# Patient Record
Sex: Female | Born: 1970 | Race: Black or African American | Hispanic: No | Marital: Single | Smoking: Never smoker
Health system: Southern US, Community
[De-identification: ages and names within clinical notes are randomized; demographics above are authoritative.]

## PROBLEM LIST (undated history)

## (undated) DIAGNOSIS — F419 Anxiety disorder, unspecified: Secondary | ICD-10-CM

## (undated) DIAGNOSIS — J069 Acute upper respiratory infection, unspecified: Secondary | ICD-10-CM

## (undated) DIAGNOSIS — J45909 Unspecified asthma, uncomplicated: Secondary | ICD-10-CM

## (undated) DIAGNOSIS — K5792 Diverticulitis of intestine, part unspecified, without perforation or abscess without bleeding: Secondary | ICD-10-CM

## (undated) DIAGNOSIS — Z972 Presence of dental prosthetic device (complete) (partial): Secondary | ICD-10-CM

## (undated) DIAGNOSIS — D573 Sickle-cell trait: Secondary | ICD-10-CM

## (undated) DIAGNOSIS — K279 Peptic ulcer, site unspecified, unspecified as acute or chronic, without hemorrhage or perforation: Secondary | ICD-10-CM

## (undated) DIAGNOSIS — D649 Anemia, unspecified: Secondary | ICD-10-CM

## (undated) DIAGNOSIS — R2232 Localized swelling, mass and lump, left upper limb: Secondary | ICD-10-CM

## (undated) DIAGNOSIS — K219 Gastro-esophageal reflux disease without esophagitis: Secondary | ICD-10-CM

## (undated) DIAGNOSIS — I10 Essential (primary) hypertension: Secondary | ICD-10-CM

## (undated) HISTORY — DX: Sickle-cell trait: D57.3

## (undated) HISTORY — DX: Peptic ulcer, site unspecified, unspecified as acute or chronic, without hemorrhage or perforation: K27.9

## (undated) HISTORY — DX: Anxiety disorder, unspecified: F41.9

## (undated) HISTORY — PX: BREAST BIOPSY: SHX20

## (undated) HISTORY — DX: Acute upper respiratory infection, unspecified: J06.9

## (undated) HISTORY — DX: Anemia, unspecified: D64.9

## (undated) HISTORY — PX: TONSILLECTOMY: SUR1361

## (undated) HISTORY — PX: ANAL FISTULECTOMY: SHX1139

## (undated) HISTORY — DX: Unspecified asthma, uncomplicated: J45.909

## (undated) HISTORY — DX: Diverticulitis of intestine, part unspecified, without perforation or abscess without bleeding: K57.92

---

## 1998-04-10 ENCOUNTER — Emergency Department (HOSPITAL_COMMUNITY): Admission: EM | Admit: 1998-04-10 | Discharge: 1998-04-10 | Payer: Self-pay | Admitting: Emergency Medicine

## 1998-08-19 ENCOUNTER — Inpatient Hospital Stay (HOSPITAL_COMMUNITY): Admission: AD | Admit: 1998-08-19 | Discharge: 1998-08-19 | Payer: Self-pay | Admitting: *Deleted

## 1998-12-08 ENCOUNTER — Emergency Department (HOSPITAL_COMMUNITY): Admission: EM | Admit: 1998-12-08 | Discharge: 1998-12-08 | Payer: Self-pay

## 1999-08-10 ENCOUNTER — Emergency Department (HOSPITAL_COMMUNITY): Admission: EM | Admit: 1999-08-10 | Discharge: 1999-08-10 | Payer: Self-pay | Admitting: Emergency Medicine

## 2000-05-07 ENCOUNTER — Emergency Department (HOSPITAL_COMMUNITY): Admission: EM | Admit: 2000-05-07 | Discharge: 2000-05-07 | Payer: Self-pay | Admitting: Emergency Medicine

## 2000-07-10 ENCOUNTER — Emergency Department (HOSPITAL_COMMUNITY): Admission: EM | Admit: 2000-07-10 | Discharge: 2000-07-10 | Payer: Self-pay | Admitting: Emergency Medicine

## 2000-07-10 ENCOUNTER — Encounter: Payer: Self-pay | Admitting: Emergency Medicine

## 2004-05-29 ENCOUNTER — Emergency Department (HOSPITAL_COMMUNITY): Admission: EM | Admit: 2004-05-29 | Discharge: 2004-05-29 | Payer: Self-pay | Admitting: Emergency Medicine

## 2005-04-25 ENCOUNTER — Emergency Department (HOSPITAL_COMMUNITY): Admission: EM | Admit: 2005-04-25 | Discharge: 2005-04-25 | Payer: Self-pay | Admitting: Emergency Medicine

## 2005-08-09 ENCOUNTER — Emergency Department (HOSPITAL_COMMUNITY): Admission: EM | Admit: 2005-08-09 | Discharge: 2005-08-10 | Payer: Self-pay | Admitting: Emergency Medicine

## 2006-02-19 ENCOUNTER — Emergency Department (HOSPITAL_COMMUNITY): Admission: EM | Admit: 2006-02-19 | Discharge: 2006-02-19 | Payer: Self-pay | Admitting: Emergency Medicine

## 2006-09-01 ENCOUNTER — Emergency Department (HOSPITAL_COMMUNITY): Admission: EM | Admit: 2006-09-01 | Discharge: 2006-09-02 | Payer: Self-pay | Admitting: Emergency Medicine

## 2006-11-29 ENCOUNTER — Emergency Department (HOSPITAL_COMMUNITY): Admission: EM | Admit: 2006-11-29 | Discharge: 2006-11-29 | Payer: Self-pay | Admitting: Emergency Medicine

## 2007-01-01 ENCOUNTER — Encounter: Payer: Self-pay | Admitting: Emergency Medicine

## 2007-01-02 ENCOUNTER — Inpatient Hospital Stay (HOSPITAL_COMMUNITY): Admission: EM | Admit: 2007-01-02 | Discharge: 2007-01-07 | Payer: Self-pay | Admitting: Emergency Medicine

## 2007-09-24 ENCOUNTER — Emergency Department (HOSPITAL_COMMUNITY): Admission: EM | Admit: 2007-09-24 | Discharge: 2007-09-24 | Payer: Self-pay | Admitting: Emergency Medicine

## 2008-11-15 ENCOUNTER — Emergency Department (HOSPITAL_COMMUNITY): Admission: EM | Admit: 2008-11-15 | Discharge: 2008-11-15 | Payer: Self-pay | Admitting: Emergency Medicine

## 2008-11-28 ENCOUNTER — Encounter: Admission: RE | Admit: 2008-11-28 | Discharge: 2008-11-28 | Payer: Self-pay | Admitting: Specialist

## 2008-11-28 ENCOUNTER — Encounter: Payer: Self-pay | Admitting: Internal Medicine

## 2008-12-19 ENCOUNTER — Ambulatory Visit: Payer: Self-pay | Admitting: Internal Medicine

## 2008-12-19 DIAGNOSIS — R05 Cough: Secondary | ICD-10-CM

## 2008-12-19 DIAGNOSIS — R519 Headache, unspecified: Secondary | ICD-10-CM | POA: Insufficient documentation

## 2008-12-19 DIAGNOSIS — I10 Essential (primary) hypertension: Secondary | ICD-10-CM | POA: Insufficient documentation

## 2008-12-19 DIAGNOSIS — J45909 Unspecified asthma, uncomplicated: Secondary | ICD-10-CM | POA: Insufficient documentation

## 2008-12-19 DIAGNOSIS — R51 Headache: Secondary | ICD-10-CM

## 2009-01-08 ENCOUNTER — Ambulatory Visit: Payer: Self-pay | Admitting: Internal Medicine

## 2009-01-20 ENCOUNTER — Encounter: Admission: RE | Admit: 2009-01-20 | Discharge: 2009-01-20 | Payer: Self-pay | Admitting: Specialist

## 2009-05-28 ENCOUNTER — Emergency Department (HOSPITAL_COMMUNITY): Admission: EM | Admit: 2009-05-28 | Discharge: 2009-05-28 | Payer: Self-pay | Admitting: Emergency Medicine

## 2009-12-28 ENCOUNTER — Emergency Department (HOSPITAL_COMMUNITY): Admission: EM | Admit: 2009-12-28 | Discharge: 2009-12-28 | Payer: Self-pay | Admitting: Family Medicine

## 2010-05-17 ENCOUNTER — Encounter: Payer: Self-pay | Admitting: Internal Medicine

## 2010-07-09 LAB — POCT URINALYSIS DIPSTICK
Ketones, ur: NEGATIVE mg/dL
Nitrite: NEGATIVE
Protein, ur: NEGATIVE mg/dL
Urobilinogen, UA: 0.2 mg/dL (ref 0.0–1.0)

## 2010-07-09 LAB — POCT PREGNANCY, URINE: Preg Test, Ur: NEGATIVE

## 2010-07-15 LAB — DIFFERENTIAL
Basophils Absolute: 0 10*3/uL (ref 0.0–0.1)
Basophils Relative: 0 % (ref 0–1)
Eosinophils Absolute: 0 10*3/uL (ref 0.0–0.7)
Neutro Abs: 3.9 10*3/uL (ref 1.7–7.7)
Neutrophils Relative %: 77 % (ref 43–77)

## 2010-07-15 LAB — POCT I-STAT, CHEM 8
Chloride: 104 mEq/L (ref 96–112)
HCT: 34 % — ABNORMAL LOW (ref 36.0–46.0)
Hemoglobin: 11.6 g/dL — ABNORMAL LOW (ref 12.0–15.0)
Potassium: 3.7 mEq/L (ref 3.5–5.1)
Sodium: 137 mEq/L (ref 135–145)

## 2010-07-15 LAB — CBC
MCHC: 34.7 g/dL (ref 30.0–36.0)
Platelets: 214 10*3/uL (ref 150–400)
RBC: 3.76 MIL/uL — ABNORMAL LOW (ref 3.87–5.11)
RDW: 12.5 % (ref 11.5–15.5)

## 2010-09-08 NOTE — H&P (Signed)
Mandy Riggs, Mandy Riggs             ACCOUNT NO.:  [id number]   MEDICAL RECORD NO.:  [id number]          PATIENT TYPE:  INP   LOCATION:  4733                         FACILITY:  [facility]   PHYSICIAN:  Wilson Singer, M.D.DATE OF BIRTH:  24-Apr-1971   DATE OF ADMISSION:  01/02/2007  DATE OF DISCHARGE:                              HISTORY & PHYSICAL   HISTORY:  This is a very pleasant, 40 year old lady who had a  tonsillectomy four days ago on December 29, 2006, and now presents with  a one-day history of fever and cough.  She has pleuritic chest pain.   PAST MEDICAL HISTORY:  Hypertension.   PAST SURGICAL HISTORY:  1. Tonsillectomy as mentioned above.  2. Surgery for anal fistula in 2001 after birth of her baby.   SOCIAL HISTORY:  She has been married for 2 years.  She does not smoke,  and does not drink alcohol.  She works at [employer] as a Engineer, water.   MEDICATIONS:  Amoxicillin, Dilaudid, and Tylenol with codeine #3 since  the tonsillectomy.  She also takes Benicar 20 mg daily.   ALLERGIES:  VICODIN, which produces itching.   REVIEW OF SYSTEMS:  Apart from the symptoms mentioned above, there are  no other symptoms referable to all systems reviewed.   PHYSICAL EXAMINATION:  VITAL SIGNS:  Temperature 102.2, blood pressure  150/90, pulse 120, saturation 98% on room air.  GENERAL:  She is not toxic, and there is no respiratory distress.  CARDIOVASCULAR:  Heart sounds are present and normal without murmurs.  LUNGS:  Lung fields are clinically clear.  ABDOMEN:  Soft and nontender, with no hepatosplenomegaly.  NEUROLOGIC:  She is alert and oriented with no focal neurological signs.   INVESTIGATIONS:  Hemoglobin 11.2, white blood cell count 11.5, platelets  277.  Sodium 134, potassium 3.6, BUN 4, glucose 111, creatinine 0.9.  D-  dimer is elevated to 0.81.  Chest x-ray is consistent with left sided  pneumonia.   IMPRESSION:  1. Left pneumonia.  2.  Possible pulmonary embolism.   PLAN:  1. Admit.  2. Intravenous antibiotics.  3. CT angio chest.   Further recommendations will depend on the patient's hospital progress.      Wilson Singer, M.D.  Electronically Signed     NCG/MEDQ  D:  01/02/2007  T:  01/02/2007  Job:  [id number]   cc:   Lacretia Leigh. Quintella Reichert, M.D.  Hermelinda Medicus, M.D.

## 2010-09-11 NOTE — Discharge Summary (Signed)
NAMEJANNELL, Mandy Riggs             ACCOUNT NO.:  [id number]   MEDICAL RECORD NO.:  [id number]          PATIENT TYPE:  INP   LOCATION:  4733                         FACILITY:  [facility]   PHYSICIAN:  Lonia Blood, M.D.      DATE OF BIRTH:  [birth year]   DATE OF ADMISSION:  01/02/2007  DATE OF DISCHARGE:  01/07/2007                               DISCHARGE SUMMARY   PRIMARY CARE PHYSICIAN:  Dr. Feliciana Rossetti.   DISCHARGE DIAGNOSES:  1. Left lower lobe pneumonia.  2. Status post tonsillectomy.  3. Anemia.  4. Hypertension.  5. Migraine headache.   DISCHARGE MEDICATIONS:  1. Augmentin 875 mg p.o. b.i.d. for 3 days.  2. Benicar 1 tablet daily.  3. Dilaudid 4 mg q.4-6 h p.r.n.  4. Tylenol with Codeine as needed.   DISPOSITION:  The patient is to follow up with her primary care  physician as needed.  Also follow up with her ENT surgeon, Dr. Hermelinda Medicus.   PROCEDURES PERFORMED THIS ADMISSION INCLUDE:  1. Chest x-ray that showed left-sided pneumonia on January 02, 2007.  2. Chest CT without contrast that ruled out pulmonary embolism.   BRIEF HISTORY AND PHYSICAL:  Please refer to dictated history and  physical on admission.  In short, however, this is a 40 year old lady  that had tonsillectomy 4 days prior to coming in.  She came in secondary  to severe pain in the back of her throat and also pleuritic chest pain.  Initial workup in the ER suggested a left lower lobe pneumonia.  Hence,  she was admitted for further management.   HOSPITAL COURSE:  1. Left lower lobe pneumonia:  The patient was admitted, started on IV      antibiotics and supportive care.  Her temperature continued to be      around 102 on admission.  She subsequently improved with      antibiotics daily.  She was on Rocephin and Zithromax but at the      time of discharge she was switched to Augmentin for both her      pneumonia as well as what appears to be tonsillitis.  2. Status post tonsillectomy:  The patient  was having severe sore      throat and problem with swallowing.  Her ENT surgeon was      subsequently consulted.  She was continued on antibiotic and also      some sprays.  She improved      tremendously and was able to eat and drink at the time of      discharge.  3. Hypertension:  The patient was continued on her antihypertensive      and she seemed to have responded to that appropriately.  Further      treatment measures were to be continued as an outpatient.      Lonia Blood, M.D.  Electronically Signed     LG/MEDQ  D:  02/22/2007  T:  02/22/2007  Job:  [id number]

## 2010-10-08 ENCOUNTER — Inpatient Hospital Stay (INDEPENDENT_AMBULATORY_CARE_PROVIDER_SITE_OTHER)
Admission: RE | Admit: 2010-10-08 | Discharge: 2010-10-08 | Disposition: A | Discharge status: Home / Self Care | Payer: 59 | Source: Ambulatory Visit | Attending: Family Medicine | Admitting: Family Medicine

## 2010-10-08 DIAGNOSIS — R51 Headache: Secondary | ICD-10-CM

## 2010-10-08 DIAGNOSIS — I10 Essential (primary) hypertension: Secondary | ICD-10-CM

## 2010-10-08 LAB — POCT I-STAT, CHEM 8
BUN: 9 mg/dL (ref 6–23)
Chloride: 104 mEq/L (ref 96–112)
HCT: 36 % (ref 36.0–46.0)
Sodium: 142 mEq/L (ref 135–145)

## 2010-11-11 ENCOUNTER — Inpatient Hospital Stay (INDEPENDENT_AMBULATORY_CARE_PROVIDER_SITE_OTHER)
Admission: RE | Admit: 2010-11-11 | Discharge: 2010-11-11 | Disposition: A | Discharge status: Home / Self Care | Payer: 59 | Source: Ambulatory Visit | Attending: Emergency Medicine | Admitting: Emergency Medicine

## 2010-11-11 DIAGNOSIS — M779 Enthesopathy, unspecified: Secondary | ICD-10-CM

## 2010-12-13 ENCOUNTER — Inpatient Hospital Stay (INDEPENDENT_AMBULATORY_CARE_PROVIDER_SITE_OTHER)
Admission: RE | Admit: 2010-12-13 | Discharge: 2010-12-13 | Disposition: A | Discharge status: Home / Self Care | Payer: Self-pay | Source: Ambulatory Visit | Attending: Family Medicine | Admitting: Family Medicine

## 2010-12-13 DIAGNOSIS — IMO0001 Reserved for inherently not codable concepts without codable children: Secondary | ICD-10-CM

## 2010-12-13 DIAGNOSIS — W19XXXA Unspecified fall, initial encounter: Secondary | ICD-10-CM

## 2011-02-05 LAB — DIFFERENTIAL
Basophils Absolute: 0
Eosinophils Relative: 1
Lymphocytes Relative: 9 — ABNORMAL LOW
Lymphs Abs: 1
Neutro Abs: 9 — ABNORMAL HIGH

## 2011-02-05 LAB — I-STAT 8, (EC8 V) (CONVERTED LAB)
Acid-Base Excess: 3 — ABNORMAL HIGH
Chloride: 100
HCT: 35 — ABNORMAL LOW
Hemoglobin: 11.9 — ABNORMAL LOW
Potassium: 3.6
Sodium: 134 — ABNORMAL LOW
TCO2: 29

## 2011-02-05 LAB — POCT I-STAT CREATININE: Operator id: 146091

## 2011-02-05 LAB — CBC
HCT: 32.3 — ABNORMAL LOW
HCT: 32.4 — ABNORMAL LOW
Hemoglobin: 11.2 — ABNORMAL LOW
MCHC: 34.2
MCHC: 34.3
MCV: 84.3
Platelets: 277
Platelets: 419 — ABNORMAL HIGH
RBC: 3.69 — ABNORMAL LOW
RDW: 12.1
RDW: 12.1
RDW: 12.3
WBC: 11.5 — ABNORMAL HIGH
WBC: 12.2 — ABNORMAL HIGH
WBC: 7.9

## 2011-02-05 LAB — BASIC METABOLIC PANEL
BUN: 6
CO2: 28
Chloride: 106
Glucose, Bld: 102 — ABNORMAL HIGH
Potassium: 4.3

## 2011-02-05 LAB — CARDIAC PANEL(CRET KIN+CKTOT+MB+TROPI)
CK, MB: 0.8
CK, MB: 0.9
Total CK: 39
Troponin I: 0.02

## 2011-02-05 LAB — URINALYSIS, ROUTINE W REFLEX MICROSCOPIC
Glucose, UA: NEGATIVE
Ketones, ur: NEGATIVE
Nitrite: NEGATIVE
Protein, ur: NEGATIVE
pH: 8

## 2011-02-05 LAB — COMPREHENSIVE METABOLIC PANEL
Alkaline Phosphatase: 41
BUN: 3 — ABNORMAL LOW
CO2: 29
Calcium: 8.8
GFR calc non Af Amer: >60
Glucose, Bld: 118 — ABNORMAL HIGH
Potassium: 3.9
Total Protein: 6.8

## 2011-02-05 LAB — CULTURE, BLOOD (ROUTINE X 2): Culture: NO GROWTH

## 2011-02-05 LAB — D-DIMER, QUANTITATIVE: D-Dimer, Quant: 0.81 — ABNORMAL HIGH

## 2011-02-08 LAB — CBC
MCHC: 34.8
MCV: 83.1
Platelets: 292
RBC: 4.12
WBC: 5.1

## 2011-02-08 LAB — DIFFERENTIAL
Eosinophils Absolute: 0.1
Eosinophils Relative: 2
Lymphocytes Relative: 27
Lymphs Abs: 1.4
Monocytes Absolute: 0.5
Monocytes Relative: 9
Neutro Abs: 3.1

## 2011-02-08 LAB — COMPREHENSIVE METABOLIC PANEL
ALT: 13
AST: 16
Albumin: 3.7
Calcium: 9.5
Chloride: 108
Creatinine, Ser: 0.77
GFR calc Af Amer: >60
GFR calc non Af Amer: >60
Glucose, Bld: 97
Total Bilirubin: 0.4

## 2011-02-08 LAB — LIPASE, BLOOD: Lipase: 22

## 2011-02-08 LAB — URINALYSIS, ROUTINE W REFLEX MICROSCOPIC
Hgb urine dipstick: NEGATIVE
Protein, ur: NEGATIVE
Urobilinogen, UA: 0.2

## 2011-02-08 LAB — PREGNANCY, URINE: Preg Test, Ur: NEGATIVE

## 2011-06-05 ENCOUNTER — Encounter (HOSPITAL_COMMUNITY): Payer: Self-pay

## 2011-06-05 ENCOUNTER — Emergency Department (HOSPITAL_COMMUNITY)
Admission: EM | Admit: 2011-06-05 | Discharge: 2011-06-05 | Disposition: A | Discharge status: Home / Self Care | Payer: 59 | Source: Home / Self Care | Attending: Family Medicine | Admitting: Family Medicine

## 2011-06-05 DIAGNOSIS — M659 Synovitis and tenosynovitis, unspecified: Secondary | ICD-10-CM

## 2011-06-05 DIAGNOSIS — M7751 Other enthesopathy of right foot: Secondary | ICD-10-CM

## 2011-06-05 HISTORY — DX: Essential (primary) hypertension: I10

## 2011-06-05 MED ORDER — DICLOFENAC POTASSIUM 50 MG PO TABS: 50.0000 mg | ORAL_TABLET | Freq: Three times a day (TID) | ORAL | Status: AC

## 2011-06-05 NOTE — ED Notes (Signed)
Pt has had rt inner leg pain that started one week ago, started at ankle and now painful up to calf.  No known injury.

## 2011-06-05 NOTE — ED Provider Notes (Signed)
History     CSN: [id number]  Arrival date & time 06/05/11  0902   First MD Initiated Contact with Patient 06/05/11 [phone]      Chief Complaint  Patient presents with  . Leg Pain    (Consider location/radiation/quality/duration/timing/severity/associated sxs/prior treatment) Patient is a 41 y.o. female presenting with leg pain. The history is provided by the patient.  Leg Pain  The incident occurred more than 1 week ago (felt burning to medial ankle and has spread to distal medial calf, no swelling,, NKI). The incident occurred at home. There was no injury mechanism. The pain is present in the left ankle. The pain is mild. She reports no foreign bodies present.    Past Medical History  Diagnosis Date  . Hypertension     History reviewed. No pertinent past surgical history.  History reviewed. No pertinent family history.  History  Substance Use Topics  . Smoking status: Never Smoker   . Smokeless tobacco: Not on file  . Alcohol Use: No    OB History    Grav Para Term Preterm Abortions TAB SAB Ect Mult Living                  Review of Systems  Constitutional: Negative.   Gastrointestinal: Negative.   Musculoskeletal: Positive for myalgias.  Skin: Negative.     Allergies  Hydrocodone  Home Medications   Current Outpatient Rx  Name Route Sig Dispense Refill  . OLMESARTAN MEDOXOMIL 5 MG PO TABS Oral Take 5 mg by mouth daily.      BP 142/91  Pulse 94  Temp(Src) 96.9 F (36.1 C) (Oral)  Resp 18  LMP 05/26/2011  Physical Exam  Nursing note and vitals reviewed. Constitutional: She is oriented to person, place, and time. She appears well-developed and well-nourished.  Musculoskeletal: She exhibits tenderness. She exhibits no edema.       Right ankle: She exhibits no swelling, no ecchymosis, no deformity and normal pulse. tenderness. Medial malleolus tenderness found. Achilles tendon normal.       Feet:  Neurological: She is alert and oriented to person,  place, and time.  Skin: Skin is warm and dry. No erythema.    ED Course  Procedures (including critical care time)  Labs Reviewed - No data to display No results found.   1. Tendonitis of ankle, right       MDM          Barkley Bruns, MD 06/05/11 [phone]

## 2012-02-22 ENCOUNTER — Other Ambulatory Visit (HOSPITAL_COMMUNITY): Payer: Self-pay | Admitting: Unknown Physician Specialty

## 2012-02-22 DIAGNOSIS — Z1231 Encounter for screening mammogram for malignant neoplasm of breast: Secondary | ICD-10-CM

## 2012-03-14 ENCOUNTER — Ambulatory Visit (HOSPITAL_COMMUNITY)
Admission: RE | Admit: 2012-03-14 | Discharge: 2012-03-14 | Disposition: A | Discharge status: Home / Self Care | Payer: 59 | Source: Ambulatory Visit | Attending: Unknown Physician Specialty | Admitting: Unknown Physician Specialty

## 2012-03-14 DIAGNOSIS — Z1231 Encounter for screening mammogram for malignant neoplasm of breast: Secondary | ICD-10-CM | POA: Insufficient documentation

## 2012-03-16 ENCOUNTER — Other Ambulatory Visit: Payer: Self-pay | Admitting: *Deleted

## 2012-03-16 ENCOUNTER — Other Ambulatory Visit: Payer: Self-pay | Admitting: Interventional Cardiology

## 2013-03-07 ENCOUNTER — Other Ambulatory Visit: Payer: Self-pay | Admitting: Unknown Physician Specialty

## 2013-03-07 DIAGNOSIS — N63 Unspecified lump in unspecified breast: Secondary | ICD-10-CM

## 2013-03-23 ENCOUNTER — Ambulatory Visit
Admission: RE | Admit: 2013-03-23 | Discharge: 2013-03-23 | Disposition: A | Discharge status: Home / Self Care | Payer: 59 | Source: Ambulatory Visit | Attending: Unknown Physician Specialty | Admitting: Unknown Physician Specialty

## 2013-03-23 DIAGNOSIS — N63 Unspecified lump in unspecified breast: Secondary | ICD-10-CM

## 2014-02-20 ENCOUNTER — Other Ambulatory Visit: Payer: Self-pay

## 2014-02-20 DIAGNOSIS — Z1231 Encounter for screening mammogram for malignant neoplasm of breast: Secondary | ICD-10-CM

## 2014-03-26 ENCOUNTER — Inpatient Hospital Stay: Admission: RE | Admit: 2014-03-26 | Payer: 59 | Source: Ambulatory Visit

## 2014-04-22 ENCOUNTER — Emergency Department (HOSPITAL_COMMUNITY)
Admission: EM | Admit: 2014-04-22 | Discharge: 2014-04-22 | Disposition: A | Discharge status: Home / Self Care | Payer: 59 | Source: Home / Self Care | Attending: Family Medicine | Admitting: Family Medicine

## 2014-04-22 ENCOUNTER — Encounter (HOSPITAL_COMMUNITY): Payer: Self-pay | Admitting: Emergency Medicine

## 2014-04-22 DIAGNOSIS — J040 Acute laryngitis: Secondary | ICD-10-CM

## 2014-04-22 MED ORDER — IPRATROPIUM BROMIDE 0.06 % NA SOLN: 2.0000 | Freq: Four times a day (QID) | NASAL | Status: DC

## 2014-04-22 MED ORDER — PREDNISONE 10 MG PO TABS: 30.0000 mg | ORAL_TABLET | Freq: Every day | ORAL | Status: DC

## 2014-04-22 MED ORDER — TRAMADOL HCL 50 MG PO TABS: 50.0000 mg | ORAL_TABLET | Freq: Every evening | ORAL | Status: DC | PRN

## 2014-04-22 NOTE — ED Notes (Signed)
Pt has been suffering from a cough and hoarse voice for approximately one week.  She has tried OTC medications with little relief.

## 2014-04-22 NOTE — ED Provider Notes (Signed)
Mandy Riggs is a 43 y.o. female who presents to Urgent Care today for hoarse voice cough congestion postnasal drip. Symptoms present for 4 days. Cough is productive. He tried multiple over-the-counter medications which have not helped. No fevers or chills or trouble breathing. Vomiting or diarrhea.   Past Medical History  Diagnosis Date  . Hypertension    No past surgical history on file. History  Substance Use Topics  . Smoking status: Never Smoker   . Smokeless tobacco: Not on file  . Alcohol Use: No   ROS as above Medications: No current facility-administered medications for this encounter.   Current Outpatient Prescriptions  Medication Sig Dispense Refill  . ipratropium (ATROVENT) 0.06 % nasal spray Place 2 sprays into both nostrils 4 (four) times daily. 15 mL 1  . olmesartan (BENICAR) 5 MG tablet Take 5 mg by mouth daily.    . predniSONE (DELTASONE) 10 MG tablet Take 3 tablets (30 mg total) by mouth daily. 15 tablet 0  . traMADol (ULTRAM) 50 MG tablet Take 1 tablet (50 mg total) by mouth at bedtime as needed (cough). 10 tablet 0   Allergies  Allergen Reactions  . Hydrocodone Itching     Exam:  BP 159/88 mmHg  Pulse 81  Temp(Src) 98.2 F (36.8 C) (Oral)  Resp 14  SpO2 98% Gen: Well NAD HEENT: EOMI,  MMM posterior pharynx with cobblestoning. Tympanic membranes occluded by cerumen bilaterally Lungs: Normal work of breathing. CTABL hoarse voice Heart: RRR no MRG Abd: NABS, Soft. Nondistended, Nontender Exts: Brisk capillary refill, warm and well perfused.   No results found for this or any previous visit (from the past 24 hour(s)). No results found.  Assessment and Plan: 43 y.o. female with laryngitis. Treatment with prednisone and Atrovent nasal spray and tramadol for cough.  Discussed warning signs or symptoms. Please see discharge instructions. Patient expresses understanding.     Gregor Hams, MD 04/22/14 [phone]

## 2014-04-22 NOTE — Discharge Instructions (Signed)
Thank you for coming in today. Call or go to the emergency room if you get worse, have trouble breathing, have chest pains, or palpitations.     Laryngitis At the top of your windpipe is your voice box. It is the source of your voice. Inside your voice box are 2 bands of muscles called vocal cords. When you breathe, your vocal cords are relaxed and open so that air can get into the lungs. When you decide to say something, these cords come together and vibrate. The sound from these vibrations goes into your throat and comes out through your mouth as sound. Laryngitis is an inflammation of the vocal cords that causes hoarseness, cough, loss of voice, sore throat, and dry throat. Laryngitis can be temporary (acute) or long-term (chronic). Most cases of acute laryngitis improve with time.Chronic laryngitis lasts for more than 3 weeks. CAUSES Laryngitis can often be related to excessive smoking, talking, or yelling, as well as inhalation of toxic fumes and allergies. Acute laryngitis is usually caused by a viral infection, vocal strain, measles or mumps, or bacterial infections. Chronic laryngitis is usually caused by vocal cord strain, vocal cord injury, postnasal drip, growths on the vocal cords, or acid reflux. SYMPTOMS   Cough.  Sore throat.  Dry throat. RISK FACTORS  Respiratory infections.  Exposure to irritating substances, such as cigarette smoke, excessive amounts of alcohol, stomach acids, and workplace chemicals.  Voice trauma, such as vocal cord injury from shouting or speaking too loud. DIAGNOSIS  Your cargiver will perform a physical exam. During the physical exam, your caregiver will examine your throat. The most common sign of laryngitis is hoarseness. Laryngoscopy may be necessary to confirm the diagnosis of this condition. This procedure allows your caregiver to look into the larynx. HOME CARE INSTRUCTIONS  Drink enough fluids to keep your urine clear or pale yellow.  Rest  until you no longer have symptoms or as directed by your caregiver.  Breathe in moist air.  Take all medicine as directed by your caregiver.  Do not smoke.  Talk as little as possible (this includes whispering).  Write on paper instead of talking until your voice is back to normal.  Follow up with your caregiver if your condition has not improved after 10 days. SEEK MEDICAL CARE IF:   You have trouble breathing.  You cough up blood.  You have persistent fever.  You have increasing pain.  You have difficulty swallowing. MAKE SURE YOU:  Understand these instructions.  Will watch your condition.  Will get help right away if you are not doing well or get worse. Document Released: 04/12/2005 Document Revised: 07/05/2011 Document Reviewed: 06/18/2010 [facility] Patient Information 2015 [place], [place]. This information is not intended to replace advice given to you by your health care provider. Make sure you discuss any questions you have with your health care provider.

## 2014-09-10 ENCOUNTER — Ambulatory Visit (HOSPITAL_BASED_OUTPATIENT_CLINIC_OR_DEPARTMENT_OTHER)
Admission: RE | Admit: 2014-09-10 | Discharge: 2014-09-10 | Disposition: A | Discharge status: Home / Self Care | Payer: 59 | Source: Ambulatory Visit | Attending: Physician Assistant | Admitting: Physician Assistant

## 2014-09-10 ENCOUNTER — Other Ambulatory Visit (HOSPITAL_BASED_OUTPATIENT_CLINIC_OR_DEPARTMENT_OTHER): Payer: Self-pay | Admitting: Physician Assistant

## 2014-09-10 DIAGNOSIS — Z86718 Personal history of other venous thrombosis and embolism: Secondary | ICD-10-CM | POA: Insufficient documentation

## 2014-09-10 DIAGNOSIS — M79605 Pain in left leg: Secondary | ICD-10-CM | POA: Diagnosis not present

## 2014-09-16 ENCOUNTER — Other Ambulatory Visit: Payer: Self-pay | Admitting: Neurology

## 2014-12-20 ENCOUNTER — Ambulatory Visit
Admission: RE | Admit: 2014-12-20 | Discharge: 2014-12-20 | Disposition: A | Discharge status: Home / Self Care | Payer: 59 | Source: Ambulatory Visit

## 2014-12-20 DIAGNOSIS — Z1231 Encounter for screening mammogram for malignant neoplasm of breast: Secondary | ICD-10-CM

## 2014-12-26 ENCOUNTER — Other Ambulatory Visit: Payer: Self-pay | Admitting: Family Medicine

## 2014-12-26 DIAGNOSIS — R2232 Localized swelling, mass and lump, left upper limb: Secondary | ICD-10-CM

## 2014-12-31 ENCOUNTER — Other Ambulatory Visit: Payer: Self-pay | Admitting: Family Medicine

## 2014-12-31 ENCOUNTER — Other Ambulatory Visit: Payer: Self-pay

## 2014-12-31 DIAGNOSIS — R2232 Localized swelling, mass and lump, left upper limb: Secondary | ICD-10-CM

## 2015-01-02 ENCOUNTER — Other Ambulatory Visit: Payer: 59

## 2015-04-07 ENCOUNTER — Ambulatory Visit
Admission: RE | Admit: 2015-04-07 | Discharge: 2015-04-07 | Disposition: A | Discharge status: Home / Self Care | Payer: 59 | Source: Ambulatory Visit | Attending: Family Medicine | Admitting: Family Medicine

## 2015-04-07 ENCOUNTER — Other Ambulatory Visit: Payer: Self-pay | Admitting: Family Medicine

## 2015-04-07 DIAGNOSIS — R2232 Localized swelling, mass and lump, left upper limb: Secondary | ICD-10-CM

## 2015-06-25 DIAGNOSIS — R2232 Localized swelling, mass and lump, left upper limb: Secondary | ICD-10-CM

## 2015-06-25 HISTORY — DX: Localized swelling, mass and lump, left upper limb: R22.32

## 2015-07-10 ENCOUNTER — Other Ambulatory Visit: Payer: Self-pay | Admitting: General Surgery

## 2015-07-14 ENCOUNTER — Encounter (HOSPITAL_BASED_OUTPATIENT_CLINIC_OR_DEPARTMENT_OTHER): Payer: Self-pay | Admitting: *Deleted

## 2015-07-14 NOTE — Pre-Procedure Instructions (Signed)
To come for EKG 

## 2015-07-16 ENCOUNTER — Encounter (HOSPITAL_BASED_OUTPATIENT_CLINIC_OR_DEPARTMENT_OTHER)
Admission: RE | Admit: 2015-07-16 | Discharge: 2015-07-16 | Disposition: A | Discharge status: Home / Self Care | Payer: 59 | Source: Ambulatory Visit | Attending: General Surgery | Admitting: General Surgery

## 2015-07-16 ENCOUNTER — Other Ambulatory Visit: Payer: Self-pay

## 2015-07-16 DIAGNOSIS — I10 Essential (primary) hypertension: Secondary | ICD-10-CM | POA: Insufficient documentation

## 2015-07-16 DIAGNOSIS — Z01810 Encounter for preprocedural cardiovascular examination: Secondary | ICD-10-CM | POA: Insufficient documentation

## 2015-07-20 NOTE — H&P (Signed)
History of Present Illness  The patient is a 45 year old female who presents with a soft tissue mass. She is referred for an enlarging soft tissue mass under her left arm. The patient first noted a small lump under her left arm about 2010. She did have an ultrasound at [facility] at that time. She feels the area has gradually enlarged. This was noted by her primary physician and she was referred for repeat ultrasound. Ultrasound showed a 3.8 x 3.4 x 1.1 cm mass in the superior left axilla which previously measured 1.9 x 0.6 x 1.8 cm in 2010. She notices it is there but does not give her pain or other discomfort.   Other Problems  Gastroesophageal Reflux Disease High blood pressure  Past Surgical History  Breast Biopsy Right. Tonsillectomy  Diagnostic Studies History  Colonoscopy 5-10 years ago Mammogram within last year  Allergies  HYDROcodone Bitartrate *CHEMICALS*  Medication History  ALPRAZolam (0.5MG  Tablet, Oral as needed) Active. AmLODIPine Besylate (5MG  Tablet, Oral) Active. Losartan Potassium (50MG  Tablet, Oral) Active. Medications Reconciled  Social History  Caffeine use Carbonated beverages, Coffee, Tea. No alcohol use No drug use Tobacco use Never smoker.  Family History  Arthritis Mother. Diabetes Mellitus Father, Mother. Hypertension Father, Mother.    Review of Systems  General Not Present- Appetite Loss, Chills, Fatigue, Fever, Night Sweats, Weight Gain and Weight Loss. Skin Not Present- Change in Wart/Mole, Dryness, Hives, Jaundice, New Lesions, Non-Healing Wounds, Rash and Ulcer. HEENT Not Present- Earache, Hearing Loss, Hoarseness, Nose Bleed, Oral Ulcers, Ringing in the Ears, Seasonal Allergies, Sinus Pain, Sore Throat, Visual Disturbances, Wears glasses/contact lenses and Yellow Eyes. Respiratory Not Present- Bloody sputum, Chronic Cough, Difficulty Breathing, Snoring and Wheezing. Cardiovascular Not Present- Chest Pain, Difficulty  Breathing Lying Down, Leg Cramps, Palpitations, Rapid Heart Rate, Shortness of Breath and Swelling of Extremities. Gastrointestinal Not Present- Abdominal Pain, Bloating, Bloody Stool, Change in Bowel Habits, Chronic diarrhea, Constipation, Difficulty Swallowing, Excessive gas, Gets full quickly at meals, Hemorrhoids, Indigestion, Nausea, Rectal Pain and Vomiting. Female Genitourinary Not Present- Frequency, Nocturia, Painful Urination, Pelvic Pain and Urgency. Musculoskeletal Not Present- Back Pain, Joint Pain, Joint Stiffness, Muscle Pain, Muscle Weakness and Swelling of Extremities. Neurological Not Present- Decreased Memory, Fainting, Headaches, Numbness, Seizures, Tingling, Tremor, Trouble walking and Weakness. Psychiatric Not Present- Anxiety, Bipolar, Change in Sleep Pattern, Depression, Fearful and Frequent crying. Endocrine Not Present- Cold Intolerance, Excessive Hunger, Hair Changes, Heat Intolerance, Hot flashes and New Diabetes. Hematology Not Present- Easy Bruising, Excessive bleeding, Gland problems, HIV and Persistent Infections.  Vitals   Weight: 189.2 lb Height: 63in Body Surface Area: 1.89 m Body Mass Index: 33.51 kg/m  Temp.: 98.27F(Temporal)  Pulse: 79 (Regular)  BP: 130/74 (Sitting, Left Arm, Standard)       Physical Exam  The physical exam findings are as follows: Note:General: Well-developed African-American female in no distress Skin: No rash or infection Lymph nodes: No cervical, supra clavicular or axillary nodes palpable Lungs: Clear equal breath sounds bilaterally Cardiac: Regular rate and rhythm. No edema Extremities: In the left axilla just into the upper medial arm is a soft fleshy freely movable mass measuring about 4 cm in greatest diameter. Neurologic: Alert and fully oriented. Affect normal. Gait normal.    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 This has  enlarged significantly since 2010 approximately doubling in size. I think malignancy is very unlikely but certainly cannot be [id number] ruled out without excision. This was  discussed with the patient. I discussed options of continued observation versus excision. We discussed the surgery in detail including its nature and expected recovery and risks of bleeding, infection and anesthetic problems. She would like to go ahead and have this removed which I think is indicated due to significant enlargement. Current Plans Pt Education - [employer] Free Text Education/Instructions: discussed with patient and provided information. Schedule for Surgery  Excision left axillary mass, probable lipoma, under local anesthesia with sedation as an outpatient

## 2015-07-21 ENCOUNTER — Ambulatory Visit (HOSPITAL_BASED_OUTPATIENT_CLINIC_OR_DEPARTMENT_OTHER): Payer: 59 | Admitting: Anesthesiology

## 2015-07-21 ENCOUNTER — Encounter (HOSPITAL_BASED_OUTPATIENT_CLINIC_OR_DEPARTMENT_OTHER): Payer: Self-pay | Admitting: *Deleted

## 2015-07-21 ENCOUNTER — Encounter (HOSPITAL_BASED_OUTPATIENT_CLINIC_OR_DEPARTMENT_OTHER)
Admission: RE | Disposition: A | Discharge status: Home / Self Care | Payer: Self-pay | Source: Ambulatory Visit | Attending: General Surgery

## 2015-07-21 ENCOUNTER — Ambulatory Visit (HOSPITAL_BASED_OUTPATIENT_CLINIC_OR_DEPARTMENT_OTHER)
Admission: RE | Admit: 2015-07-21 | Discharge: 2015-07-21 | Disposition: A | Discharge status: Home / Self Care | Payer: 59 | Source: Ambulatory Visit | Attending: General Surgery | Admitting: General Surgery

## 2015-07-21 DIAGNOSIS — D171 Benign lipomatous neoplasm of skin and subcutaneous tissue of trunk: Secondary | ICD-10-CM | POA: Insufficient documentation

## 2015-07-21 DIAGNOSIS — Z79899 Other long term (current) drug therapy: Secondary | ICD-10-CM | POA: Insufficient documentation

## 2015-07-21 DIAGNOSIS — I10 Essential (primary) hypertension: Secondary | ICD-10-CM | POA: Insufficient documentation

## 2015-07-21 DIAGNOSIS — K219 Gastro-esophageal reflux disease without esophagitis: Secondary | ICD-10-CM | POA: Insufficient documentation

## 2015-07-21 DIAGNOSIS — D1720 Benign lipomatous neoplasm of skin and subcutaneous tissue of unspecified limb: Secondary | ICD-10-CM

## 2015-07-21 DIAGNOSIS — R222 Localized swelling, mass and lump, trunk: Secondary | ICD-10-CM | POA: Diagnosis present

## 2015-07-21 DIAGNOSIS — R2232 Localized swelling, mass and lump, left upper limb: Secondary | ICD-10-CM | POA: Diagnosis not present

## 2015-07-21 DIAGNOSIS — D1739 Benign lipomatous neoplasm of skin and subcutaneous tissue of other sites: Secondary | ICD-10-CM | POA: Diagnosis not present

## 2015-07-21 HISTORY — DX: Presence of dental prosthetic device (complete) (partial): Z97.2

## 2015-07-21 HISTORY — DX: Localized swelling, mass and lump, left upper limb: R22.32

## 2015-07-21 HISTORY — DX: Gastro-esophageal reflux disease without esophagitis: K21.9

## 2015-07-21 HISTORY — PX: MASS EXCISION: SHX2000

## 2015-07-21 SURGERY — EXCISION MASS
Anesthesia: Monitor Anesthesia Care | Site: Axilla | Laterality: Left

## 2015-07-21 MED ORDER — MIDAZOLAM HCL 2 MG/2ML IJ SOLN
INTRAMUSCULAR | Status: AC
  Filled 2015-07-21: qty 2

## 2015-07-21 MED ORDER — FENTANYL CITRATE (PF) 100 MCG/2ML IJ SOLN
50.0000 ug | INTRAMUSCULAR | Status: DC | PRN
  Administered 2015-07-21: 100 ug via INTRAVENOUS

## 2015-07-21 MED ORDER — LACTATED RINGERS IV SOLN
INTRAVENOUS | Status: DC
  Administered 2015-07-21: 14:00:00 via INTRAVENOUS

## 2015-07-21 MED ORDER — CHLORHEXIDINE GLUCONATE 4 % EX LIQD: 1.0000 "application " | Freq: Once | CUTANEOUS | Status: DC

## 2015-07-21 MED ORDER — FENTANYL CITRATE (PF) 100 MCG/2ML IJ SOLN
INTRAMUSCULAR | Status: AC
  Filled 2015-07-21: qty 2

## 2015-07-21 MED ORDER — LIDOCAINE HCL 1 % IJ SOLN
INTRAMUSCULAR | Status: DC | PRN
  Administered 2015-07-21: 10 mL via INTRADERMAL

## 2015-07-21 MED ORDER — FENTANYL CITRATE (PF) 100 MCG/2ML IJ SOLN: 25.0000 ug | INTRAMUSCULAR | Status: DC | PRN

## 2015-07-21 MED ORDER — ONDANSETRON HCL 4 MG/2ML IJ SOLN: 4.0000 mg | Freq: Once | INTRAMUSCULAR | Status: DC | PRN

## 2015-07-21 MED ORDER — GLYCOPYRROLATE 0.2 MG/ML IJ SOLN: 0.2000 mg | Freq: Once | INTRAMUSCULAR | Status: DC | PRN

## 2015-07-21 MED ORDER — ONDANSETRON HCL 4 MG/2ML IJ SOLN
INTRAMUSCULAR | Status: DC | PRN
  Administered 2015-07-21: 4 mg via INTRAVENOUS

## 2015-07-21 MED ORDER — DEXTROSE 5 % IV SOLN
2.0000 g | INTRAVENOUS | Status: AC
  Administered 2015-07-21: 2 g via INTRAVENOUS

## 2015-07-21 MED ORDER — CEFAZOLIN SODIUM-DEXTROSE 2-4 GM/100ML-% IV SOLN
INTRAVENOUS | Status: AC
  Filled 2015-07-21: qty 100

## 2015-07-21 MED ORDER — ONDANSETRON HCL 4 MG/2ML IJ SOLN
INTRAMUSCULAR | Status: AC
  Filled 2015-07-21: qty 2

## 2015-07-21 MED ORDER — SCOPOLAMINE 1 MG/3DAYS TD PT72: 1.0000 | MEDICATED_PATCH | Freq: Once | TRANSDERMAL | Status: DC | PRN

## 2015-07-21 MED ORDER — MIDAZOLAM HCL 2 MG/2ML IJ SOLN
1.0000 mg | INTRAMUSCULAR | Status: DC | PRN
  Administered 2015-07-21: 2 mg via INTRAVENOUS

## 2015-07-21 MED ORDER — TRAMADOL HCL 50 MG PO TABS: 50.0000 mg | ORAL_TABLET | Freq: Four times a day (QID) | ORAL | Status: DC | PRN

## 2015-07-21 MED ORDER — PROPOFOL 10 MG/ML IV BOLUS
INTRAVENOUS | Status: AC
Start: 2015-07-21 — End: 2015-07-21
  Filled 2015-07-21: qty 20

## 2015-07-21 SURGICAL SUPPLY — 59 items
APL SKNCLS STERI-STRIP NONHPOA (GAUZE/BANDAGES/DRESSINGS)
BENZOIN TINCTURE PRP APPL 2/3 (GAUZE/BANDAGES/DRESSINGS) IMPLANT
BLADE CLIPPER SURG (BLADE) IMPLANT
BLADE SURG 15 STRL LF DISP TIS (BLADE) ×1 IMPLANT
BLADE SURG 15 STRL SS (BLADE) ×3
CANISTER SUCT 1200ML W/VALVE (MISCELLANEOUS) IMPLANT
CHLORAPREP W/TINT 26ML (MISCELLANEOUS) ×3 IMPLANT
CLEANER CAUTERY TIP 5X5 PAD (MISCELLANEOUS) ×1 IMPLANT
CLOSURE WOUND 1/2 X4 (GAUZE/BANDAGES/DRESSINGS)
CLOSURE WOUND 1/4X4 (GAUZE/BANDAGES/DRESSINGS)
COVER BACK TABLE 60X90IN (DRAPES) ×3 IMPLANT
COVER MAYO STAND STRL (DRAPES) ×3 IMPLANT
DECANTER SPIKE VIAL GLASS SM (MISCELLANEOUS) IMPLANT
DRAIN CHANNEL 7F FF FLAT (WOUND CARE) IMPLANT
DRAPE LAPAROTOMY 100X72 PEDS (DRAPES) ×3 IMPLANT
DRAPE UTILITY XL STRL (DRAPES) ×3 IMPLANT
ELECT COATED BLADE 2.86 ST (ELECTRODE) ×2 IMPLANT
ELECT REM PT RETURN 9FT ADLT (ELECTROSURGICAL) ×3
ELECTRODE REM PT RTRN 9FT ADLT (ELECTROSURGICAL) ×1 IMPLANT
EVACUATOR SILICONE 100CC (DRAIN) IMPLANT
GLOVE BIOGEL PI IND STRL 8 (GLOVE) ×1 IMPLANT
GLOVE BIOGEL PI INDICATOR 8 (GLOVE) ×2
GLOVE ECLIPSE 7.5 STRL STRAW (GLOVE) ×1 IMPLANT
GLOVE SURG SS PI 6.5 STRL IVOR (GLOVE) ×2 IMPLANT
GLOVE SURG SS PI 7.5 STRL IVOR (GLOVE) ×2 IMPLANT
GOWN STRL REUS W/ TWL LRG LVL3 (GOWN DISPOSABLE) ×1 IMPLANT
GOWN STRL REUS W/ TWL XL LVL3 (GOWN DISPOSABLE) ×1 IMPLANT
GOWN STRL REUS W/TWL LRG LVL3 (GOWN DISPOSABLE) ×3
GOWN STRL REUS W/TWL XL LVL3 (GOWN DISPOSABLE) ×3
LIQUID BAND (GAUZE/BANDAGES/DRESSINGS) ×2 IMPLANT
NDL HYPO 25X1 1.5 SAFETY (NEEDLE) ×1 IMPLANT
NDL HYPO 30GX1 BEV (NEEDLE) IMPLANT
NEEDLE HYPO 25X1 1.5 SAFETY (NEEDLE) ×3 IMPLANT
NEEDLE HYPO 30GX1 BEV (NEEDLE) IMPLANT
NS IRRIG 1000ML POUR BTL (IV SOLUTION) IMPLANT
PACK BASIN DAY SURGERY FS (CUSTOM PROCEDURE TRAY) ×3 IMPLANT
PAD CLEANER CAUTERY TIP 5X5 (MISCELLANEOUS)
PENCIL BUTTON HOLSTER BLD 10FT (ELECTRODE) ×3 IMPLANT
SPONGE GAUZE 4X4 12PLY STER LF (GAUZE/BANDAGES/DRESSINGS) IMPLANT
STRIP CLOSURE SKIN 1/2X4 (GAUZE/BANDAGES/DRESSINGS) IMPLANT
STRIP CLOSURE SKIN 1/4X4 (GAUZE/BANDAGES/DRESSINGS) IMPLANT
SUT ETHILON 3 0 PS 1 (SUTURE) IMPLANT
SUT ETHILON 4 0 PS 2 18 (SUTURE) IMPLANT
SUT ETHILON 5 0 P 3 18 (SUTURE)
SUT ETHILON 5 0 PS 2 18 (SUTURE) IMPLANT
SUT MNCRL AB 4-0 PS2 18 (SUTURE) ×3 IMPLANT
SUT NYLON ETHILON 5-0 P-3 1X18 (SUTURE) IMPLANT
SUT VIC AB 3-0 54X BRD REEL (SUTURE) IMPLANT
SUT VIC AB 3-0 BRD 54 (SUTURE)
SUT VIC AB 3-0 SH 27 (SUTURE)
SUT VIC AB 3-0 SH 27X BRD (SUTURE) IMPLANT
SUT VICRYL 3-0 CR8 SH (SUTURE) ×2 IMPLANT
SUT VICRYL 4-0 PS2 18IN ABS (SUTURE) IMPLANT
SYR CONTROL 10ML LL (SYRINGE) ×3 IMPLANT
TOWEL OR 17X24 6PK STRL BLUE (TOWEL DISPOSABLE) ×4 IMPLANT
TOWEL OR NON WOVEN STRL DISP B (DISPOSABLE) ×1 IMPLANT
TUBE CONNECTING 20'X1/4 (TUBING)
TUBE CONNECTING 20X1/4 (TUBING) IMPLANT
YANKAUER SUCT BULB TIP NO VENT (SUCTIONS) IMPLANT

## 2015-07-21 NOTE — Transfer of Care (Signed)
Immediate Anesthesia Transfer of Care Note  Patient: Mandy Riggs  Procedure(s) Performed: Procedure(s): EXCISION MASS LEFT AXILLA (Left)  Patient Location: PACU  Anesthesia Type:MAC  Level of Consciousness: awake, alert  and oriented  Airway & Oxygen Therapy: Patient Spontanous Breathing  Post-op Assessment: Report given to RN and Post -op Vital signs reviewed and stable  Post vital signs: Reviewed and stable  Last Vitals:  Filed Vitals:   07/21/15 1312  BP: 145/83  Pulse: 78  Temp: 36.8 C  Resp: 18    Complications: No apparent anesthesia complications

## 2015-07-21 NOTE — Anesthesia Postprocedure Evaluation (Signed)
Anesthesia Post Note  Patient: Mandy Riggs  Procedure(s) Performed: Procedure(s) (LRB): EXCISION MASS LEFT AXILLA (Left)  Patient location during evaluation: PACU Anesthesia Type: MAC Level of consciousness: awake and alert Pain management: pain level controlled Vital Signs Assessment: post-procedure vital signs reviewed and stable Respiratory status: spontaneous breathing, nonlabored ventilation, respiratory function stable and patient connected to nasal cannula oxygen Cardiovascular status: blood pressure returned to baseline and stable Postop Assessment: no signs of nausea or vomiting Anesthetic complications: no    Last Vitals:  Filed Vitals:   07/21/15 1453 07/21/15 1500  BP: 136/83 139/100  Pulse: 82 81  Temp:    Resp: 13 19    Last Pain:  Filed Vitals:   07/21/15 1501  PainSc: 0-No pain                 Maylie Ashton JENNETTE

## 2015-07-21 NOTE — Op Note (Signed)
Preoperative Diagnosis: mass left axilla  Postoprative Diagnosis: mass left axilla  Procedure: Procedure(s): EXCISION MASS LEFT AXILLA   Surgeon: Excell Seltzer T   Assistants: None  Anesthesia:  Monitored Local Anesthesia with Sedation  Indications: Patient presents with an enlarging and symptomatic deep subcutaneous soft tissue mass in the left axilla consistent with a lipoma and measuring 4 cm on ultrasound and physical exam. After discussion of options and risks detailed elsewhere we have elected to proceed with excision under local anesthesia with sedation.    Procedure Detail:  Patient was brought to the operating room, placed in the supine position on the operating table and IV sedation administered. The left arm was carefully positioned extended and the axilla and upper arm were widely sterilely prepped and draped. Patient timeout was performed and correct procedure verified. Local anesthesia was used to infiltrate the overlying skin and subcutaneous tissue and deep soft tissue. I made an incision in the skin crease in the high axilla just toward the upper arm directly over the mass and dissection was carried down through the subcutaneous tissue sharply. I came down onto a well encapsulated soft fatty mass consistent with a lipoma. This was sharply dissected away from surrounding soft tissue and was very discrete and measured about 4 x 2.5 cm. It was excised completely. A small vascular pedicle was clamped and tied with 3-0 Vicryl. Hemostasis was obtained with cautery. The deeper and subcutaneous tissue was closed with interrupted 3-0 Vicryl and the skin with subcuticular running 4-0 Monocryl and Liquiban. Sponge needle and instrument counts were correct.    Findings: As above  Specimens: Subcutaneous mass, probable lipoma, left axilla        Complications:  * No complications entered in OR log *         Disposition: PACU - hemodynamically stable.         Condition: stable

## 2015-07-21 NOTE — Discharge Instructions (Signed)
[facility] Office Phone Number [phone]   POST OP INSTRUCTIONS  Always review your discharge instruction sheet given to you by the facility where your surgery was performed.  IF YOU HAVE DISABILITY OR FAMILY LEAVE FORMS, YOU MUST BRING THEM TO THE OFFICE FOR PROCESSING.  DO NOT GIVE THEM TO YOUR DOCTOR.  1. A prescription for pain medication may be given to you upon discharge.  Take your pain medication as prescribed, if needed.  If narcotic pain medicine is not needed, then you may take acetaminophen (Tylenol) or ibuprofen (Advil) as needed. 2. Take your usually prescribed medications unless otherwise directed 3. If you need a refill on your pain medication, please contact your pharmacy.  They will contact our office to request authorization.  Prescriptions will not be filled after 5pm or on week-ends. 4. You should eat very light the first 24 hours after surgery, such as soup, crackers, pudding, etc.  Resume your normal diet the day after surgery. 5. Most patients will experience some swelling and bruising.  Ice packs may help.  Swelling and bruising can take several days to resolve.  6. It is common to experience some constipation if taking pain medication after surgery.  Increasing fluid intake and taking a stool softener will usually help or prevent this problem from occurring.  A mild laxative (Milk of Magnesia or Miralax) should be taken according to package directions if there are no bowel movements after 48 hours. 7. Unless discharge instructions indicate otherwise, you may remove your bandages 24-48 hours after surgery, and you may shower at that time.  You may have steri-strips (small skin tapes) in place directly over the incision.  These strips should be left on the skin for 7-10 days.  If your surgeon used skin glue on the incision, you may shower in 24 hours.  The glue will flake off over the next 2-3 weeks.  Any sutures or staples will be removed at the office  during your follow-up visit. a. ACTIVITIES:  You may resume regular daily activities (gradually increasing) beginning the next day.  You may drive when you no longer are taking prescription pain medication, you can comfortably wear a seatbelt, and you can safely maneuver your car and apply brakes. b. RETURN TO WORK:  _______No strenuous activity left arm for 1 week_______________________________________________________________________________ 8. You should see your doctor in the office for a follow-up appointment approximately two weeks after your surgery.  Your doctors nurse will typically make your follow-up appointment when she calls you with your pathology report.  Expect your pathology report 2-3 business days after your surgery.  You may call to check if you do not hear from [place] after three days. 9. OTHER INSTRUCTIONS: _______________________________________________________________________________________________ _____________________________________________________________________________________________________________________________________ _____________________________________________________________________________________________________________________________________ _____________________________________________________________________________________________________________________________________  WHEN TO CALL YOUR DOCTOR: 1. Fever over 101.0 2. Nausea and/or vomiting. 3. Extreme swelling or bruising. 4. Continued bleeding from incision. 5. Increased pain, redness, or drainage from the incision.  The clinic staff is available to answer your questions during regular business hours.  Please dont hesitate to call and ask to speak to one of the nurses for clinical concerns.  If you have a medical emergency, go to the nearest emergency room or call 911.  A surgeon from [facility] is always on call at the hospital.  For further questions, please visit  [email or web address]    Post Anesthesia Home Care Instructions  Activity: Get plenty of rest for the remainder of the day. A responsible adult should stay with you for  24 hours following the procedure.  For the next 24 hours, DO NOT: -Drive a car -Paediatric nurse -Drink alcoholic beverages -Take any medication unless instructed by your physician -Make any legal decisions or sign important papers.  Meals: Start with liquid foods such as gelatin or soup. Progress to regular foods as tolerated. Avoid greasy, spicy, heavy foods. If nausea and/or vomiting occur, drink only clear liquids until the nausea and/or vomiting subsides. Call your physician if vomiting continues.  Special Instructions/Symptoms: Your throat may feel dry or sore from the anesthesia or the breathing tube placed in your throat during surgery. If this causes discomfort, gargle with warm salt water. The discomfort should disappear within 24 hours.  If you had a scopolamine patch placed behind your ear for the management of post- operative nausea and/or vomiting:  1. The medication in the patch is effective for 72 hours, after which it should be removed.  Wrap patch in a tissue and discard in the trash. Wash hands thoroughly with soap and water. 2. You may remove the patch earlier than 72 hours if you experience unpleasant side effects which may include dry mouth, dizziness or visual disturbances. 3. Avoid touching the patch. Wash your hands with soap and water after contact with the patch.

## 2015-07-21 NOTE — Anesthesia Preprocedure Evaluation (Addendum)
Anesthesia Evaluation  Patient identified by MRN, date of birth, ID band Patient awake    Reviewed: Allergy & Precautions, NPO status , Patient's Chart, lab work & pertinent test results  History of Anesthesia Complications Negative for: history of anesthetic complications  Airway Mallampati: II  TM Distance: >3 FB Neck ROM: Full    Dental no notable dental hx. (+) Partial Lower, Dental Advisory Given   Pulmonary asthma ,    Pulmonary exam normal breath sounds clear to auscultation       Cardiovascular hypertension, Pt. on medications Normal cardiovascular exam Rhythm:Regular Rate:Normal     Neuro/Psych  Headaches, negative psych ROS   GI/Hepatic Neg liver ROS, GERD  Medicated and Controlled,  Endo/Other  obesity  Renal/GU negative Renal ROS  negative genitourinary   Musculoskeletal negative musculoskeletal ROS (+)   Abdominal   Peds negative pediatric ROS (+)  Hematology negative hematology ROS (+)   Anesthesia Other Findings   Reproductive/Obstetrics negative OB ROS                            Anesthesia Physical Anesthesia Plan  ASA: II  Anesthesia Plan: MAC   Post-op Pain Management:    Induction: Intravenous  Airway Management Planned:   Additional Equipment:   Intra-op Plan:   Post-operative Plan:   Informed Consent: I have reviewed the patients History and Physical, chart, labs and discussed the procedure including the risks, benefits and alternatives for the proposed anesthesia with the patient or authorized representative who has indicated his/her understanding and acceptance.   Dental advisory given  Plan Discussed with: CRNA  Anesthesia Plan Comments: (Patient will discuss with Dr. Excell Seltzer. She prefers local only and no anesthesia but if Dr. Excell Seltzer believes it is too big or too deep of a mass, she will accept MAC. Awaiting that decision. )        Anesthesia Quick Evaluation

## 2015-07-21 NOTE — Interval H&P Note (Signed)
History and Physical Interval Note:  07/21/2015 2:02 PM  Mandy Riggs  has presented today for surgery, with the diagnosis of mass left axilla  The various methods of treatment have been discussed with the patient and family. After consideration of risks, benefits and other options for treatment, the patient has consented to  Procedure(s): EXCISION MASS LEFT AXILLA (Left) as a surgical intervention .  The patient's history has been reviewed, patient examined, no change in status, stable for surgery.  I have reviewed the patient's chart and labs.  Questions were answered to the patient's satisfaction.     [name] T

## 2015-07-22 ENCOUNTER — Encounter (HOSPITAL_BASED_OUTPATIENT_CLINIC_OR_DEPARTMENT_OTHER): Payer: Self-pay | Admitting: General Surgery

## 2015-07-24 DIAGNOSIS — N751 Abscess of Bartholin's gland: Secondary | ICD-10-CM | POA: Diagnosis not present

## 2015-07-24 DIAGNOSIS — Z113 Encounter for screening for infections with a predominantly sexual mode of transmission: Secondary | ICD-10-CM | POA: Diagnosis not present

## 2015-07-24 DIAGNOSIS — N761 Subacute and chronic vaginitis: Secondary | ICD-10-CM | POA: Diagnosis not present

## 2015-09-23 ENCOUNTER — Encounter (HOSPITAL_BASED_OUTPATIENT_CLINIC_OR_DEPARTMENT_OTHER): Payer: Self-pay | Admitting: Emergency Medicine

## 2015-09-23 ENCOUNTER — Emergency Department (HOSPITAL_BASED_OUTPATIENT_CLINIC_OR_DEPARTMENT_OTHER)
Admission: EM | Admit: 2015-09-23 | Discharge: 2015-09-23 | Disposition: A | Discharge status: Home / Self Care | Payer: 59 | Attending: Emergency Medicine | Admitting: Emergency Medicine

## 2015-09-23 DIAGNOSIS — I10 Essential (primary) hypertension: Secondary | ICD-10-CM | POA: Diagnosis not present

## 2015-09-23 DIAGNOSIS — R51 Headache: Secondary | ICD-10-CM | POA: Insufficient documentation

## 2015-09-23 DIAGNOSIS — Z79899 Other long term (current) drug therapy: Secondary | ICD-10-CM | POA: Diagnosis not present

## 2015-09-23 DIAGNOSIS — R519 Headache, unspecified: Secondary | ICD-10-CM

## 2015-09-23 MED ORDER — METOCLOPRAMIDE HCL 10 MG PO TABS: 10.0000 mg | ORAL_TABLET | Freq: Four times a day (QID) | ORAL | Status: DC | PRN

## 2015-09-23 MED ORDER — METOCLOPRAMIDE HCL 5 MG/ML IJ SOLN
10.0000 mg | Freq: Once | INTRAMUSCULAR | Status: AC
  Administered 2015-09-23: 10 mg via INTRAVENOUS
  Filled 2015-09-23: qty 2

## 2015-09-23 MED ORDER — SODIUM CHLORIDE 0.9 % IV BOLUS (SEPSIS)
1000.0000 mL | Freq: Once | INTRAVENOUS | Status: AC
  Administered 2015-09-23: 1000 mL via INTRAVENOUS

## 2015-09-23 MED ORDER — DIPHENHYDRAMINE HCL 50 MG/ML IJ SOLN
25.0000 mg | Freq: Once | INTRAMUSCULAR | Status: AC
  Administered 2015-09-23: 25 mg via INTRAVENOUS
  Filled 2015-09-23: qty 1

## 2015-09-23 MED ORDER — KETOROLAC TROMETHAMINE 15 MG/ML IJ SOLN
15.0000 mg | Freq: Once | INTRAMUSCULAR | Status: AC
  Administered 2015-09-23: 15 mg via INTRAVENOUS
  Filled 2015-09-23: qty 1

## 2015-09-23 NOTE — ED Provider Notes (Signed)
CSN: [id number]     Arrival date & time 09/23/15  0137 History   First MD Initiated Contact with Patient 09/23/15 0151     Chief Complaint  Patient presents with  . Headache     (Consider location/radiation/quality/duration/timing/severity/associated sxs/prior Treatment) HPI  This is a 45 year old female with a remote history of migraines. She is here with a three-day history of a headache. The onset was gradual. It is moderate to severe now. The pain is located in the temples and forehead as well as behind the right eye. The right eye pain just began yesterday. She has taken over-the-counter medications without relief. She has had nausea and retching but no frank vomiting. She denies blurred vision, photophobia or focal neuro changes. Pain is worse lying supine.   Past Medical History  Diagnosis Date  . Mass of left axilla 06/2015  . Acid reflux     occasionally  . Wears partial dentures     upper  . Hypertension     states under control with meds., has been on med. x "a while", per pt.   Past Surgical History  Procedure Laterality Date  . Tonsillectomy    . Anal fistulectomy    . Mass excision Left 07/21/2015    Procedure: EXCISION MASS LEFT AXILLA;  Surgeon: Excell Seltzer, MD;  Location: [place];  Service: General;  Laterality: Left;   No family history on file. Social History  Substance Use Topics  . Smoking status: Never Smoker   . Smokeless tobacco: Never Used  . Alcohol Use: No   OB History    No data available     Review of Systems  All other systems reviewed and are negative.   Allergies  Latex and Hydrocodone  Home Medications   Prior to Admission medications   Medication Sig Start Date End Date Taking? Authorizing Provider  ALPRAZolam Duanne Moron) 0.5 MG tablet Take 0.5 mg by mouth at bedtime as needed for anxiety.    Historical Provider, MD  amLODipine (NORVASC) 5 MG tablet Take 5 mg by mouth daily.    Historical Provider, MD  losartan  (COZAAR) 50 MG tablet Take 50 mg by mouth daily.    Historical Provider, MD  Multiple Vitamin (MULTIVITAMIN) tablet Take 1 tablet by mouth daily.    Historical Provider, MD  omeprazole (PRILOSEC) 20 MG capsule Take 20 mg by mouth daily.    Historical Provider, MD  traMADol (ULTRAM) 50 MG tablet Take 1-2 tablets (50-100 mg total) by mouth every 6 (six) hours as needed. 07/21/15   Excell Seltzer, MD  vitamin B-12 (CYANOCOBALAMIN) 100 MCG tablet Take 100 mcg by mouth daily.    Historical Provider, MD   BP 163/107 mmHg  Pulse 83  Temp(Src) 98.2 F (36.8 C) (Oral)  Resp 18  Ht 5\' 3"  (1.6 m)  Wt 186 lb (84.369 kg)  BMI 32.96 kg/m2  SpO2 99%   Physical Exam  General: Well-developed, well-nourished female in no acute distress; appearance consistent with age of record HENT: normocephalic; atraumatic Eyes: pupils equal, round and reactive to light; extraocular muscles intact Neck: supple Heart: regular rate and rhythm Lungs: clear to auscultation bilaterally Abdomen: soft; nondistended; nontender; bowel sounds present Extremities: No deformity; full range of motion; pulses normal Neurologic: Awake, alert and oriented; motor function intact in all extremities and symmetric; no facial droop; normal coordination, gait and speech Skin: Warm and dry Psychiatric: Flat affect    ED Course  Procedures (including critical care time)  MDM  2:36 AM Should feeling better after IV fluids and medications. States she is ready to go home.      Shanon Rosser, MD 09/23/15 [phone]

## 2015-09-23 NOTE — ED Notes (Signed)
Pt c/o HA x 3 days. Pt states pain is behind eyes mostly. HA is worse with laying down. Pt denies any nausea or photosensitivity. Pt does reports some visual disturbance.

## 2015-10-10 DIAGNOSIS — F419 Anxiety disorder, unspecified: Secondary | ICD-10-CM | POA: Diagnosis not present

## 2015-10-10 DIAGNOSIS — M7661 Achilles tendinitis, right leg: Secondary | ICD-10-CM | POA: Diagnosis not present

## 2015-10-10 DIAGNOSIS — I10 Essential (primary) hypertension: Secondary | ICD-10-CM | POA: Diagnosis not present

## 2015-10-10 DIAGNOSIS — K219 Gastro-esophageal reflux disease without esophagitis: Secondary | ICD-10-CM | POA: Diagnosis not present

## 2015-10-10 MED FILL — MELOXICAM 15 MG TABLET: 15 | 30 days supply | Qty: 30 | Fill #0

## 2015-10-10 MED FILL — LOSARTAN POTASSIUM 50 MG TA: 50 | 90 days supply | Qty: 90 | Fill #0

## 2015-10-10 MED FILL — ALPRAZolam 0.5 MG TABS: 0.5 | 5 days supply | Qty: 10 | Fill #0

## 2015-10-10 MED FILL — AMLODIPINE BESYLATE 10 MG T: 10 | 90 days supply | Qty: 90 | Fill #0

## 2015-11-06 DIAGNOSIS — R5383 Other fatigue: Secondary | ICD-10-CM | POA: Diagnosis not present

## 2015-11-06 DIAGNOSIS — I456 Pre-excitation syndrome: Secondary | ICD-10-CM | POA: Diagnosis not present

## 2015-11-06 DIAGNOSIS — R0609 Other forms of dyspnea: Secondary | ICD-10-CM | POA: Diagnosis not present

## 2015-11-10 ENCOUNTER — Telehealth: Payer: Self-pay | Admitting: *Deleted

## 2015-11-10 NOTE — Telephone Encounter (Signed)
Received records from [facility] for appointment with Dr Oval Linsey on 01/02/16.  Records given to [facility] (medical records) for Dr Blenda Mounts schedule on 01/02/16. lp

## 2016-01-02 ENCOUNTER — Ambulatory Visit: Payer: 59 | Admitting: Cardiovascular Disease

## 2016-01-14 DIAGNOSIS — N920 Excessive and frequent menstruation with regular cycle: Secondary | ICD-10-CM | POA: Diagnosis not present

## 2016-01-14 MED FILL — MEDROXYPROGESTERONE 10 MG T: 10 | 7 days supply | Qty: 14 | Fill #0

## 2016-01-14 MED FILL — AMLODIPINE BESYLATE 10 MG T: 10 | 90 days supply | Qty: 90 | Fill #1

## 2016-01-14 MED FILL — LOSARTAN POTASSIUM 50 MG TA: 50 | 90 days supply | Qty: 90 | Fill #1

## 2016-01-26 DIAGNOSIS — Z01419 Encounter for gynecological examination (general) (routine) without abnormal findings: Secondary | ICD-10-CM | POA: Diagnosis not present

## 2016-01-29 DIAGNOSIS — Z01419 Encounter for gynecological examination (general) (routine) without abnormal findings: Secondary | ICD-10-CM | POA: Diagnosis not present

## 2016-02-10 DIAGNOSIS — I10 Essential (primary) hypertension: Secondary | ICD-10-CM | POA: Diagnosis not present

## 2016-02-10 DIAGNOSIS — E559 Vitamin D deficiency, unspecified: Secondary | ICD-10-CM | POA: Diagnosis not present

## 2016-02-10 DIAGNOSIS — R252 Cramp and spasm: Secondary | ICD-10-CM | POA: Diagnosis not present

## 2016-04-05 DIAGNOSIS — M25562 Pain in left knee: Secondary | ICD-10-CM | POA: Diagnosis not present

## 2016-04-05 DIAGNOSIS — K219 Gastro-esophageal reflux disease without esophagitis: Secondary | ICD-10-CM | POA: Diagnosis not present

## 2016-04-05 DIAGNOSIS — E559 Vitamin D deficiency, unspecified: Secondary | ICD-10-CM | POA: Diagnosis not present

## 2016-04-05 DIAGNOSIS — I10 Essential (primary) hypertension: Secondary | ICD-10-CM | POA: Diagnosis not present

## 2016-04-05 DIAGNOSIS — F419 Anxiety disorder, unspecified: Secondary | ICD-10-CM | POA: Diagnosis not present

## 2016-04-08 MED FILL — LOSARTAN POTASSIUM 50 MG TA: 50 | 90 days supply | Qty: 90 | Fill #0

## 2016-04-08 MED FILL — AMLODIPINE BESYLATE 10 MG T: 10 | 90 days supply | Qty: 90 | Fill #0

## 2016-04-08 MED FILL — ALPRAZolam 0.5 MG TABS: 0.5 | 5 days supply | Qty: 10 | Fill #0

## 2016-04-16 DIAGNOSIS — N390 Urinary tract infection, site not specified: Secondary | ICD-10-CM | POA: Diagnosis not present

## 2016-04-27 ENCOUNTER — Ambulatory Visit (INDEPENDENT_AMBULATORY_CARE_PROVIDER_SITE_OTHER): Payer: 59 | Admitting: Physician Assistant

## 2016-04-27 ENCOUNTER — Telehealth: Payer: Self-pay

## 2016-04-27 VITALS — BP 122/82 | HR 94 | Temp 97.9°F | Resp 16 | Ht 63.0 in | Wt 193.0 lb

## 2016-04-27 DIAGNOSIS — R05 Cough: Secondary | ICD-10-CM | POA: Diagnosis not present

## 2016-04-27 DIAGNOSIS — R059 Cough, unspecified: Secondary | ICD-10-CM

## 2016-04-27 DIAGNOSIS — R0981 Nasal congestion: Secondary | ICD-10-CM

## 2016-04-27 DIAGNOSIS — J069 Acute upper respiratory infection, unspecified: Secondary | ICD-10-CM | POA: Diagnosis not present

## 2016-04-27 MED ORDER — FLUTICASONE PROPIONATE 50 MCG/ACT NA SUSP: 2.0000 | Freq: Every day | NASAL | 0 refills | Status: DC

## 2016-04-27 MED ORDER — BENZONATATE 100 MG PO CAPS: 100.0000 mg | ORAL_CAPSULE | Freq: Three times a day (TID) | ORAL | 0 refills | Status: DC | PRN

## 2016-04-27 MED ORDER — PROMETHAZINE-DM 6.25-15 MG/5ML PO SYRP: 5.0000 mL | ORAL_SOLUTION | Freq: Four times a day (QID) | ORAL | 0 refills | Status: DC | PRN

## 2016-04-27 MED FILL — BENZONATATE 100 MG CAPSULE: 100 | 7 days supply | Qty: 40 | Fill #0

## 2016-04-27 MED FILL — FLUTICASONE PROP 50 MCG SPR: 50 | 30 days supply | Qty: 16 | Fill #0

## 2016-04-27 MED FILL — PROMETHAZINE-DM SYRUP: 6.25-15 | 6 days supply | Qty: 118 | Fill #0

## 2016-04-27 NOTE — Patient Instructions (Addendum)
-   We will treat this as a respiratory viral infection.  - I recommend you rest, drink plenty of fluids, eat light meals including soups.  - You may use OTC 12 hour sudafed as long as your bp remains controlled as it was today in office. You can use OTC afrin for your nasal congestion for the next three days, use flonase as well, once you stop afrin, continue flonase daily for nasal congestion. You may use cough syrup at night for your cough and sore throat, Tessalon pearls during the day. Be aware that cough syrup can definitely make you drowsy and sleepy so do not drive or operate any heavy machinery if it is affecting you during the day.  - You may also use Tylenol or ibuprofen over-the-counter for your sore throat.  - Please let me know if you are not seeing any improvement or get worse in 5-7 days.     IF you received an x-ray today, you will receive an invoice from [facility]. Please contact [facility] at [phone] with questions or concerns regarding your invoice.   IF you received labwork today, you will receive an invoice from [facility]. Please contact [facility] at [phone] with questions or concerns regarding your invoice.   Our billing staff will not be able to assist you with questions regarding bills from these companies.  You will be contacted with the lab results as soon as they are available. The fastest way to get your results is to activate your My Chart account. Instructions are located on the last page of this paperwork. If you have not heard from [place] regarding the results in 2 weeks, please contact this office.

## 2016-04-27 NOTE — Telephone Encounter (Signed)
Pt is very very anxious that she speak with someone about her list of medications and having the meds that she does not take removed from her chart   Best number [phone]   After patient called a second time about this I sent her to [place] voice mail   And explained that she was just seen today and that only certain people can remove this and they may have not had time to remove these items

## 2016-04-27 NOTE — Telephone Encounter (Signed)
Pt advised medication that she is no longer taken were given to [place] and removed.

## 2016-04-27 NOTE — Addendum Note (Signed)
Addended by: Tenna Delaine D on: 04/27/2016 11:32 AM   Modules accepted: Orders

## 2016-04-27 NOTE — Telephone Encounter (Signed)
Pt checking on status of this message.  °

## 2016-04-27 NOTE — Progress Notes (Signed)
MRN: [id number] DOB: 1970-07-25  Subjective:   Mandy Riggs is a 46 y.o. female presenting for chief complaint of Cough (x 4 days/ chest congestion); URI (x 4 days); and Sinusitis  Reports 5 day history of sinus congestion, rhinorrhea, sore throat and dry cough (no hemoptysis), chills. Has tried intermittent OTC mucinex, and honey tea with mild relief. Denies fever, sinus headache, sinus congestion, ear pain, wheezing, shortness of breath, chest tightness and myalgia, night sweats, fatigue, weight loss, vomiting, abdominal pain and diarrhea. Has  had  sick contact with daughter who had a called. Mild history of seasonal allergies; no history of asthma. Patient has had flu shot this season. Denies smoking and alcohol use. Denies any other aggravating or relieving factors, no other questions or concerns.  Mandy Riggs has a current medication list which includes the following prescription(s): amlodipine, losartan, multivitamin, omeprazole, vitamin b-12, alprazolam, metoclopramide, and tramadol. Also is allergic to latex; ace inhibitors; hct [hydrochlorothiazide]; and hydrocodone.  Mandy Riggs  has a past medical history of Acid reflux; Hypertension; Mass of left axilla (06/2015); and Wears partial dentures. Also  has a past surgical history that includes Tonsillectomy; Anal fistulectomy; and Mass excision (Left, 07/21/2015).   Objective:   Vitals: BP 122/82   Pulse 94   Temp 97.9 F (36.6 C) (Oral)   Resp 16   Ht 5\' 3"  (1.6 m)   Wt 193 lb (87.5 kg)   LMP 04/16/2016   SpO2 98%   BMI 34.19 kg/m   Physical Exam  Constitutional: She is oriented to person, place, and time. She appears well-developed and well-nourished. No distress (mild, sitting on exam table).  HENT:  Head: Normocephalic and atraumatic.  Right Ear: Tympanic membrane, external ear and ear canal normal.  Left Ear: Tympanic membrane, external ear and ear canal normal.  Nose: Mucosal edema ( more prominent on left side) present. No  rhinorrhea. Right sinus exhibits no maxillary sinus tenderness and no frontal sinus tenderness. Left sinus exhibits no maxillary sinus tenderness and no frontal sinus tenderness.  Mouth/Throat: Uvula is midline, oropharynx is clear and moist and mucous membranes are normal.  Eyes: Conjunctivae are normal.  Neck: Normal range of motion.  Cardiovascular: Normal rate, regular rhythm and normal heart sounds.   Pulmonary/Chest: Effort normal and breath sounds normal.  Lymphadenopathy:       Head (right side): No submental, no submandibular, no tonsillar, no preauricular, no posterior auricular and no occipital adenopathy present.       Head (left side): No submental, no submandibular, no tonsillar, no preauricular, no posterior auricular and no occipital adenopathy present.    She has no cervical adenopathy.       Right: No supraclavicular adenopathy present.  Neurological: She is alert and oriented to person, place, and time.  Skin: Skin is warm and dry.  Psychiatric: She has a normal mood and affect.  Vitals reviewed.   No results found for this or any previous visit (from the past 24 hour(s)).  Assessment and Plan :  1. Cough - promethazine-dextromethorphan (PROMETHAZINE-DM) 6.25-15 MG/5ML syrup; Take 5 mLs by mouth 4 (four) times daily as needed for cough.  Dispense: 118 mL; Refill: 0 - benzonatate (TESSALON) 100 MG capsule; Take 1-2 capsules (100-200 mg total) by mouth 3 (three) times daily as needed for cough.  Dispense: 40 capsule; Refill: 0  2. Nasal congestion - fluticasone (FLONASE) 50 MCG/ACT nasal spray; Place 2 sprays into both nostrils daily.  Dispense: 16 g; Refill: 0  3. Acute  upper respiratory infection Likely viral etiology. Will treat symptomatically. Return to clinic if symptoms worsen, do not improve in 5 days, or as needed  Tenna Delaine, PA-C  [facility] 04/27/2016 9:12 AM

## 2016-04-28 ENCOUNTER — Other Ambulatory Visit: Payer: Self-pay | Admitting: *Deleted

## 2016-04-28 NOTE — Telephone Encounter (Signed)
Patient called back again today about her medication list.  She states that the medications she is no longer taking are still on her list and wants them removed.  Please advise  [phone]

## 2016-04-28 NOTE — Telephone Encounter (Signed)
Spoke with patient and took off all her medication

## 2016-04-28 NOTE — Telephone Encounter (Signed)
Left message for patient to give a list of medication she is still seeing that need to be taken off

## 2016-06-11 DIAGNOSIS — N890 Mild vaginal dysplasia: Secondary | ICD-10-CM | POA: Diagnosis not present

## 2016-06-11 DIAGNOSIS — D259 Leiomyoma of uterus, unspecified: Secondary | ICD-10-CM | POA: Diagnosis not present

## 2016-06-11 MED FILL — IBUPROFEN 800 MG TABLET: 800 | 5 days supply | Qty: 30 | Fill #0

## 2016-06-11 MED FILL — FUROSEMIDE 20 MG TABLET: 20 | 10 days supply | Qty: 10 | Fill #0

## 2016-06-28 DIAGNOSIS — D250 Submucous leiomyoma of uterus: Secondary | ICD-10-CM | POA: Diagnosis not present

## 2016-06-28 DIAGNOSIS — N926 Irregular menstruation, unspecified: Secondary | ICD-10-CM | POA: Diagnosis not present

## 2016-06-28 DIAGNOSIS — D251 Intramural leiomyoma of uterus: Secondary | ICD-10-CM | POA: Diagnosis not present

## 2016-06-28 MED FILL — MEDROXYPROGESTERONE 10 MG T: 10 | 14 days supply | Qty: 14 | Fill #0

## 2016-07-06 MED FILL — LOSARTAN POTASSIUM 50 MG TA: 50 | 90 days supply | Qty: 90 | Fill #1

## 2016-07-06 MED FILL — AMLODIPINE BESYLATE 10 MG T: 10 | 90 days supply | Qty: 90 | Fill #1

## 2016-07-14 ENCOUNTER — Other Ambulatory Visit: Payer: Self-pay | Admitting: Family Medicine

## 2016-07-14 DIAGNOSIS — Z1231 Encounter for screening mammogram for malignant neoplasm of breast: Secondary | ICD-10-CM

## 2016-07-14 MED FILL — MEDROXYPROGESTERONE 10 MG T: 10 | 14 days supply | Qty: 14 | Fill #1

## 2016-07-15 ENCOUNTER — Ambulatory Visit
Admission: RE | Admit: 2016-07-15 | Discharge: 2016-07-15 | Disposition: A | Discharge status: Home / Self Care | Payer: 59 | Source: Ambulatory Visit | Attending: Family Medicine | Admitting: Family Medicine

## 2016-07-15 DIAGNOSIS — Z1231 Encounter for screening mammogram for malignant neoplasm of breast: Secondary | ICD-10-CM | POA: Diagnosis not present

## 2016-07-22 DIAGNOSIS — R102 Pelvic and perineal pain: Secondary | ICD-10-CM | POA: Diagnosis not present

## 2016-07-22 DIAGNOSIS — D259 Leiomyoma of uterus, unspecified: Secondary | ICD-10-CM | POA: Diagnosis not present

## 2016-07-22 DIAGNOSIS — N890 Mild vaginal dysplasia: Secondary | ICD-10-CM | POA: Diagnosis not present

## 2016-08-16 MED FILL — MEDROXYPROGESTERONE 10 MG T: 10 | 14 days supply | Qty: 14 | Fill #2

## 2016-08-25 DIAGNOSIS — M79672 Pain in left foot: Secondary | ICD-10-CM | POA: Diagnosis not present

## 2016-08-25 MED FILL — predniSONE 10 MG TABS: 10 | 6 days supply | Qty: 21 | Fill #0

## 2016-08-26 ENCOUNTER — Ambulatory Visit
Admission: RE | Admit: 2016-08-26 | Discharge: 2016-08-26 | Disposition: A | Discharge status: Home / Self Care | Payer: 59 | Source: Ambulatory Visit | Attending: Physician Assistant | Admitting: Physician Assistant

## 2016-08-26 ENCOUNTER — Other Ambulatory Visit: Payer: Self-pay | Admitting: Physician Assistant

## 2016-08-26 DIAGNOSIS — M79672 Pain in left foot: Secondary | ICD-10-CM | POA: Diagnosis not present

## 2016-10-26 DIAGNOSIS — D259 Leiomyoma of uterus, unspecified: Secondary | ICD-10-CM | POA: Diagnosis not present

## 2016-10-26 DIAGNOSIS — Z0000 Encounter for general adult medical examination without abnormal findings: Secondary | ICD-10-CM | POA: Diagnosis not present

## 2016-10-26 DIAGNOSIS — I10 Essential (primary) hypertension: Secondary | ICD-10-CM | POA: Diagnosis not present

## 2016-10-26 DIAGNOSIS — K219 Gastro-esophageal reflux disease without esophagitis: Secondary | ICD-10-CM | POA: Diagnosis not present

## 2016-10-26 DIAGNOSIS — M79672 Pain in left foot: Secondary | ICD-10-CM | POA: Diagnosis not present

## 2016-10-26 DIAGNOSIS — E559 Vitamin D deficiency, unspecified: Secondary | ICD-10-CM | POA: Diagnosis not present

## 2016-10-26 DIAGNOSIS — F419 Anxiety disorder, unspecified: Secondary | ICD-10-CM | POA: Diagnosis not present

## 2016-10-26 DIAGNOSIS — Z23 Encounter for immunization: Secondary | ICD-10-CM | POA: Diagnosis not present

## 2016-10-26 DIAGNOSIS — D649 Anemia, unspecified: Secondary | ICD-10-CM | POA: Diagnosis not present

## 2016-10-26 MED FILL — AMLODIPINE BESYLATE 10 MG T: 10 | 90 days supply | Qty: 90 | Fill #0

## 2016-10-26 MED FILL — LOSARTAN POTASSIUM 50 MG TA: 50 | 90 days supply | Qty: 90 | Fill #0

## 2016-10-26 MED FILL — ALPRAZolam 0.5 MG TABS: 0.5 | 5 days supply | Qty: 10 | Fill #0

## 2016-11-28 ENCOUNTER — Telehealth: Payer: 59 | Admitting: Family

## 2016-11-28 DIAGNOSIS — R197 Diarrhea, unspecified: Secondary | ICD-10-CM | POA: Diagnosis not present

## 2016-11-28 MED ORDER — ONDANSETRON HCL 4 MG PO TABS: 4.0000 mg | ORAL_TABLET | Freq: Three times a day (TID) | ORAL | 0 refills | Status: DC | PRN

## 2016-11-28 NOTE — Progress Notes (Signed)
We are sorry that you are not feeling well.  Here is how we plan to help!  Based on what you have shared with me it looks like you have Acute Infectious Diarrhea.  Most cases of acute diarrhea are due to infections with virus and bacteria and are self-limited conditions lasting less than 14 days.  For your symptoms you may take Imodium 2 mg tablets that are over the counter at your local pharmacy. Take two tablet now and then one after each loose stool up to 6 a day.  Antibiotics are not needed for most people with diarrhea.  Zofran 4 mg 1 tablet every 8 hours as needed for nausea and vomiting   HOME CARE  We recommend changing your diet to help with your symptoms for the next few days.  Drink plenty of fluids that contain water salt and sugar. Sports drinks such as Gatorade may help.   You may try broths, soups, bananas, applesauce, soft breads, mashed potatoes or crackers.   You are considered infectious for as long as the diarrhea continues. Hand washing or use of alcohol based hand sanitizers is recommend.  It is best to stay out of work or school until your symptoms stop.   GET HELP RIGHT AWAY  If you have dark yellow colored urine or do not pass urine frequently you should drink more fluids.    If your symptoms worsen   If you feel like you are going to pass out (faint)  You have a new problem  MAKE SURE YOU   Understand these instructions.  Will watch your condition.  Will get help right away if you are not doing well or get worse.  Your e-visit answers were reviewed by a board certified advanced clinical practitioner to complete your personal care plan.  Depending on the condition, your plan could have included both over the counter or prescription medications.  If there is a problem please reply  once you have received a response from your provider.  Your safety is important to [place].  If you have drug allergies check your prescription carefully.    You can use  MyChart to ask questions about today's visit, request a non-urgent call back, or ask for a work or school excuse for 24 hours related to this e-Visit. If it has been greater than 24 hours you will need to follow up with your provider, or enter a new e-Visit to address those concerns.   You will get an e-mail in the next two days asking about your experience.  I hope that your e-visit has been valuable and will speed your recovery. Thank you for using e-visits.

## 2016-11-29 ENCOUNTER — Emergency Department (HOSPITAL_BASED_OUTPATIENT_CLINIC_OR_DEPARTMENT_OTHER): Payer: 59

## 2016-11-29 ENCOUNTER — Emergency Department (HOSPITAL_BASED_OUTPATIENT_CLINIC_OR_DEPARTMENT_OTHER)
Admission: EM | Admit: 2016-11-29 | Discharge: 2016-11-29 | Disposition: A | Discharge status: Home / Self Care | Payer: 59 | Attending: Emergency Medicine | Admitting: Emergency Medicine

## 2016-11-29 ENCOUNTER — Encounter (HOSPITAL_BASED_OUTPATIENT_CLINIC_OR_DEPARTMENT_OTHER): Payer: Self-pay | Admitting: Radiology

## 2016-11-29 DIAGNOSIS — R110 Nausea: Secondary | ICD-10-CM | POA: Insufficient documentation

## 2016-11-29 DIAGNOSIS — Z91040 Latex allergy status: Secondary | ICD-10-CM | POA: Diagnosis not present

## 2016-11-29 DIAGNOSIS — K5792 Diverticulitis of intestine, part unspecified, without perforation or abscess without bleeding: Secondary | ICD-10-CM | POA: Diagnosis not present

## 2016-11-29 DIAGNOSIS — J45909 Unspecified asthma, uncomplicated: Secondary | ICD-10-CM | POA: Diagnosis not present

## 2016-11-29 DIAGNOSIS — K5732 Diverticulitis of large intestine without perforation or abscess without bleeding: Secondary | ICD-10-CM | POA: Diagnosis not present

## 2016-11-29 DIAGNOSIS — R1032 Left lower quadrant pain: Secondary | ICD-10-CM | POA: Diagnosis not present

## 2016-11-29 DIAGNOSIS — R197 Diarrhea, unspecified: Secondary | ICD-10-CM | POA: Diagnosis not present

## 2016-11-29 DIAGNOSIS — I10 Essential (primary) hypertension: Secondary | ICD-10-CM | POA: Diagnosis not present

## 2016-11-29 DIAGNOSIS — K529 Noninfective gastroenteritis and colitis, unspecified: Secondary | ICD-10-CM | POA: Diagnosis not present

## 2016-11-29 DIAGNOSIS — Z79899 Other long term (current) drug therapy: Secondary | ICD-10-CM | POA: Diagnosis not present

## 2016-11-29 DIAGNOSIS — R1084 Generalized abdominal pain: Secondary | ICD-10-CM | POA: Diagnosis not present

## 2016-11-29 DIAGNOSIS — R109 Unspecified abdominal pain: Secondary | ICD-10-CM | POA: Diagnosis not present

## 2016-11-29 LAB — CBC
HCT: 33.5 % — ABNORMAL LOW (ref 36.0–46.0)
Hemoglobin: 11.7 g/dL — ABNORMAL LOW (ref 12.0–15.0)
MCH: 29.3 pg (ref 26.0–34.0)
MCHC: 34.9 g/dL (ref 30.0–36.0)
MCV: 83.8 fL (ref 78.0–100.0)
PLATELETS: 317 10*3/uL (ref 150–400)
RBC: 4 MIL/uL (ref 3.87–5.11)
RDW: 12 % (ref 11.5–15.5)
WBC: 9.6 10*3/uL (ref 4.0–10.5)

## 2016-11-29 LAB — URINALYSIS, ROUTINE W REFLEX MICROSCOPIC
BILIRUBIN URINE: NEGATIVE
Glucose, UA: NEGATIVE mg/dL
KETONES UR: NEGATIVE mg/dL
Leukocytes, UA: NEGATIVE
Nitrite: NEGATIVE
PROTEIN: 100 mg/dL — AB
SPECIFIC GRAVITY, URINE: 1.013 (ref 1.005–1.030)
pH: 5 (ref 5.0–8.0)

## 2016-11-29 LAB — COMPREHENSIVE METABOLIC PANEL
ALBUMIN: 4.1 g/dL (ref 3.5–5.0)
ALT: 13 U/L — ABNORMAL LOW (ref 14–54)
AST: 17 U/L (ref 15–41)
Alkaline Phosphatase: 39 U/L (ref 38–126)
Anion gap: 8 (ref 5–15)
BILIRUBIN TOTAL: 0.5 mg/dL (ref 0.3–1.2)
BUN: 7 mg/dL (ref 6–20)
CALCIUM: 9.1 mg/dL (ref 8.9–10.3)
CO2: 23 mmol/L (ref 22–32)
CREATININE: 0.67 mg/dL (ref 0.44–1.00)
Chloride: 106 mmol/L (ref 101–111)
GFR calc non Af Amer: >60 mL/min (ref >60)
GLUCOSE: 117 mg/dL — AB (ref 65–99)
Potassium: 3.7 mmol/L (ref 3.5–5.1)
Sodium: 137 mmol/L (ref 135–145)
TOTAL PROTEIN: 7.6 g/dL (ref 6.5–8.1)

## 2016-11-29 LAB — URINALYSIS, MICROSCOPIC (REFLEX): WBC, UA: NONE SEEN WBC/hpf (ref 0–5)

## 2016-11-29 LAB — LIPASE, BLOOD: Lipase: 20 U/L (ref 11–51)

## 2016-11-29 MED ORDER — DICYCLOMINE HCL 10 MG/ML IM SOLN
20.0000 mg | Freq: Once | INTRAMUSCULAR | Status: AC
  Administered 2016-11-29: 20 mg via INTRAMUSCULAR
  Filled 2016-11-29: qty 2

## 2016-11-29 MED ORDER — ONDANSETRON 4 MG PO TBDP: 4.0000 mg | ORAL_TABLET | Freq: Three times a day (TID) | ORAL | 0 refills | Status: DC | PRN

## 2016-11-29 MED ORDER — METRONIDAZOLE 500 MG PO TABS: 500.0000 mg | ORAL_TABLET | Freq: Three times a day (TID) | ORAL | 0 refills | Status: AC

## 2016-11-29 MED ORDER — SODIUM CHLORIDE 0.9 % IV BOLUS (SEPSIS)
1000.0000 mL | Freq: Once | INTRAVENOUS | Status: AC
  Administered 2016-11-29: 1000 mL via INTRAVENOUS

## 2016-11-29 MED ORDER — IOPAMIDOL (ISOVUE-300) INJECTION 61%
100.0000 mL | Freq: Once | INTRAVENOUS | Status: AC | PRN
  Administered 2016-11-29: 100 mL via INTRAVENOUS

## 2016-11-29 MED ORDER — CIPROFLOXACIN HCL 500 MG PO TABS: 500.0000 mg | ORAL_TABLET | Freq: Two times a day (BID) | ORAL | 0 refills | Status: AC

## 2016-11-29 MED ORDER — DICYCLOMINE HCL 20 MG PO TABS: 20.0000 mg | ORAL_TABLET | Freq: Two times a day (BID) | ORAL | 0 refills | Status: DC | PRN

## 2016-11-29 MED ORDER — ONDANSETRON HCL 4 MG/2ML IJ SOLN
4.0000 mg | Freq: Once | INTRAMUSCULAR | Status: AC
  Administered 2016-11-29: 4 mg via INTRAVENOUS
  Filled 2016-11-29: qty 2

## 2016-11-29 NOTE — ED Notes (Signed)
Pt called out for increased abd cramping and nausea.  Will notify MD.

## 2016-11-29 NOTE — ED Provider Notes (Signed)
[facility] Provider Note   CSN: [id number] Arrival date & time: 11/29/16  1937  By signing my name below, I, Dora Sims, attest that this documentation has been prepared under the direction and in the presence of physician practitioner, Gareth Morgan, MD. Electronically Signed: Dora Sims, Scribe. 11/29/2016. 8:30 PM.  History   Chief Complaint Chief Complaint  Patient presents with  . Abdominal Pain   The history is provided by the patient. No language interpreter was used.    HPI Comments: Mandy Riggs is a 46 y.o. female who presents to the Emergency Department complaining of persistent abdominal cramping and diarrhea beginning around 4 AM yesterday morning. The diarrhea and cramping are worse after eating. The cramping is transiently relieved with bowel movements. Patient states her abdominal pain is most significant in the LLQ and the periumbilical region. She reports associated lightheadedness as well as nausea and dry heaving without any vomiting. Patient also notes some bright red blood, small amt, over the last 24 hours. She believes her symptoms may be related to food that she ate at a cookout two days ago. Patient has been taking ibuprofen without significant improvement. She was evaluated by her PCP earlier today and was prescribed Cipro and Zofran. She has taken her first dose of the Cipro but otherwise has not been on antibiotics recently. Patient was advised by her PCP to eat jello and applesauce which she has done without improvement. She has had six episodes of diarrhea since evaluation by her PCP this afternoon. No h/o diverticulitis. No h/o abdominal surgery. Patient denies chest pain, dyspnea, melena, fevers, chills, urinary urgency/frequency, syncope, or any other associated symptoms.  Past Medical History:  Diagnosis Date  . Acid reflux    occasionally  . Hypertension    states under control with meds., has been on med. x "a while", per pt.  .  Mass of left axilla 06/2015  . Wears partial dentures    upper    Patient Active Problem List   Diagnosis Date Noted  . HYPERTENSION 12/19/2008  . ASTHMA 12/19/2008  . HEADACHE, CHRONIC 12/19/2008  . COUGH 12/19/2008    Past Surgical History:  Procedure Laterality Date  . ANAL FISTULECTOMY    . BREAST BIOPSY Right    benign core  . MASS EXCISION Left 07/21/2015   Procedure: EXCISION MASS LEFT AXILLA;  Surgeon: Excell Seltzer, MD;  Location: [facility];  Service: General;  Laterality: Left;  . TONSILLECTOMY      OB History    No data available       Home Medications    Prior to Admission medications   Medication Sig Start Date End Date Taking? Authorizing Provider  amLODipine (NORVASC) 10 MG tablet  04/08/16   [provider]  benzonatate (TESSALON) 100 MG capsule Take 1-2 capsules (100-200 mg total) by mouth 3 (three) times daily as needed for cough. 04/27/16   Tenna Delaine D, PA-C  ciprofloxacin (CIPRO) 500 MG tablet Take 1 tablet (500 mg total) by mouth every 12 (twelve) hours. 11/29/16 12/09/16  Gareth Morgan, MD  dicyclomine (BENTYL) 20 MG tablet Take 1 tablet (20 mg total) by mouth 2 (two) times daily as needed for spasms (abdominal pain). 11/29/16   Gareth Morgan, MD  fluticasone (FLONASE) 50 MCG/ACT nasal spray Place 2 sprays into both nostrils daily. 04/27/16   Tenna Delaine D, PA-C  losartan (COZAAR) 50 MG tablet Take 50 mg by mouth daily.    [provider]  metroNIDAZOLE (FLAGYL) 500 MG tablet Take 1 tablet (500 mg total) by mouth 3 (three) times daily. 11/29/16 12/09/16  Gareth Morgan, MD  ondansetron (ZOFRAN ODT) 4 MG disintegrating tablet Take 1 tablet (4 mg total) by mouth every 8 (eight) hours as needed for nausea or vomiting. 11/29/16   Gareth Morgan, MD  ondansetron (ZOFRAN) 4 MG tablet Take 1 tablet (4 mg total) by mouth every 8 (eight) hours as needed for nausea or vomiting. 11/28/16   Sharion Balloon, FNP    promethazine-dextromethorphan (PROMETHAZINE-DM) 6.25-15 MG/5ML syrup Take 5 mLs by mouth 4 (four) times daily as needed for cough. 04/27/16   Leonie Douglas, PA-C    Family History Family History  Problem Relation Age of Onset  . Breast cancer Neg Hx     Social History Social History  Substance Use Topics  . Smoking status: Never Smoker  . Smokeless tobacco: Never Used  . Alcohol use No     Allergies   Latex; Ace inhibitors; Hct [hydrochlorothiazide]; and Hydrocodone   Review of Systems Review of Systems  Constitutional: Negative for chills and fever.  HENT: Negative for sore throat.   Eyes: Negative for visual disturbance.  Respiratory: Negative for cough and shortness of breath.   Cardiovascular: Negative for chest pain.  Gastrointestinal: Positive for abdominal pain, blood in stool, diarrhea and nausea. Negative for vomiting.       Negative for melena.  Genitourinary: Negative for difficulty urinating, frequency and urgency.  Musculoskeletal: Negative for back pain and neck pain.  Skin: Negative for rash.  Neurological: Positive for light-headedness. Negative for syncope and headaches.   Physical Exam Updated Vital Signs BP 114/72 (BP Location: Left Arm)   Pulse 83   Temp 98.9 F (37.2 C) (Oral)   Resp 16   SpO2 98%   Physical Exam  Constitutional: She is oriented to person, place, and time. She appears well-developed and well-nourished. No distress.  HENT:  Head: Normocephalic and atraumatic.  Eyes: Conjunctivae and EOM are normal.  Neck: Normal range of motion. Neck supple. No tracheal deviation present.  Cardiovascular: Normal rate, regular rhythm, normal heart sounds and intact distal pulses.  Exam reveals no gallop and no friction rub.   No murmur heard. Pulmonary/Chest: Effort normal and breath sounds normal. No respiratory distress. She has no wheezes. She has no rales.  Abdominal: Soft. She exhibits no distension. There is tenderness (LLQ). There  is no rebound and no guarding.  No cva tenderness  Musculoskeletal: Normal range of motion. She exhibits no edema or tenderness.  Neurological: She is alert and oriented to person, place, and time.  Skin: Skin is warm and dry. No rash noted. She is not diaphoretic. No erythema.  Psychiatric: She has a normal mood and affect. Her behavior is normal.  Nursing note and vitals reviewed.  ED Treatments / Results  Labs (all labs ordered are listed, but only abnormal results are displayed) Labs Reviewed  COMPREHENSIVE METABOLIC PANEL - Abnormal; Notable for the following:       Result Value   Glucose, Bld 117 (*)    ALT 13 (*)    All other components within normal limits  CBC - Abnormal; Notable for the following:    Hemoglobin 11.7 (*)    HCT 33.5 (*)    All other components within normal limits  URINALYSIS, ROUTINE W REFLEX MICROSCOPIC - Abnormal; Notable for the following:    Hgb urine dipstick SMALL (*)    Protein, ur 100 (*)  All other components within normal limits  URINALYSIS, MICROSCOPIC (REFLEX) - Abnormal; Notable for the following:    Bacteria, UA RARE (*)    Squamous Epithelial / LPF 0-5 (*)    All other components within normal limits  LIPASE, BLOOD    EKG  EKG Interpretation None       Radiology Ct Abdomen Pelvis W Contrast  Result Date: 11/29/2016 CLINICAL DATA:  Acute onset of left lower quadrant abdominal pain and periumbilical pain. Diarrhea and nausea. Abdominal cramping. Initial encounter. EXAM: CT ABDOMEN AND PELVIS WITH CONTRAST TECHNIQUE: Multidetector CT imaging of the abdomen and pelvis was performed using the standard protocol following bolus administration of intravenous contrast. CONTRAST:  148mL ISOVUE-300 IOPAMIDOL (ISOVUE-300) INJECTION 61% COMPARISON:  Abdominal radiograph performed 12/28/2009 FINDINGS: Lower chest: The visualized lung bases are grossly clear. The visualized portions of the mediastinum are unremarkable. Hepatobiliary: The liver is  unremarkable in appearance. The gallbladder is unremarkable in appearance. The common bile duct remains normal in caliber. Pancreas: The pancreas is within normal limits. Spleen: The spleen is unremarkable in appearance. Adrenals/Urinary Tract: The adrenal glands are unremarkable in appearance. A small left renal cyst is noted. There is no evidence of hydronephrosis. No renal or ureteral stones are identified. No perinephric stranding is seen. Stomach/Bowel: The stomach is unremarkable in appearance. The small bowel is within normal limits. The appendix is normal in caliber, without evidence of appendicitis. The colon is unremarkable in appearance. Mild soft tissue inflammation is noted along the distal descending colon, with associated diverticula, likely reflecting mild acute diverticulitis. There is no definite evidence of perforation or abscess formation at this time. Vascular/Lymphatic: The abdominal aorta is unremarkable in appearance. The inferior vena cava is grossly unremarkable. No retroperitoneal lymphadenopathy is seen. No pelvic sidewall lymphadenopathy is identified. Reproductive: The bladder is mildly distended and grossly unremarkable. Numerous large uterine fibroids are noted. The ovaries are relatively symmetric. No suspicious adnexal masses are seen. Other: No additional soft tissue abnormalities are seen. Musculoskeletal: No acute osseous abnormalities are identified. The visualized musculature is unremarkable in appearance. IMPRESSION: 1. Mild soft tissue inflammation along the distal descending colon, with associated diverticula, likely reflecting mild acute diverticulitis. No definite evidence of perforation or abscess formation at this time. 2. Small left renal cyst noted. Electronically Signed   By: Garald Balding M.D.   On: 11/29/2016 23:03    Procedures Procedures (including critical care time)  DIAGNOSTIC STUDIES: Oxygen Saturation is 100% on RA, normal by my interpretation.     COORDINATION OF CARE: 8:27 PM Discussed treatment plan with pt at bedside and pt agreed to plan.  Medications Ordered in ED Medications  sodium chloride 0.9 % bolus 1,000 mL (0 mLs Intravenous Stopped 11/29/16 2143)  dicyclomine (BENTYL) injection 20 mg (20 mg Intramuscular Given 11/29/16 2034)  ondansetron (ZOFRAN) injection 4 mg (4 mg Intravenous Given 11/29/16 2235)  iopamidol (ISOVUE-300) 61 % injection 100 mL (100 mLs Intravenous Contrast Given 11/29/16 2248)     Initial Impression / Assessment and Plan / ED Course  I have reviewed the triage vital signs and the nursing notes.  Pertinent labs & imaging results that were available during my care of the patient were reviewed by me and considered in my medical decision making (see chart for details).     46yo female with history of hypertension presents with concern for left lower quadrant pain, nausea and diarrhea.  CT shows mild acute diverticulitis, no perforation or abscess.  Given rx for bentyl, zofran, cipro  and flagyl. Patient discharged in stable condition with understanding of reasons to return.   Final Clinical Impressions(s) / ED Diagnoses   Final diagnoses:  Diverticulitis    New Prescriptions Discharge Medication List as of 11/29/2016 11:38 PM    START taking these medications   Details  ciprofloxacin (CIPRO) 500 MG tablet Take 1 tablet (500 mg total) by mouth every 12 (twelve) hours., Starting Mon 11/29/2016, Until Thu 12/09/2016, Print    dicyclomine (BENTYL) 20 MG tablet Take 1 tablet (20 mg total) by mouth 2 (two) times daily as needed for spasms (abdominal pain)., Starting Mon 11/29/2016, Print    metroNIDAZOLE (FLAGYL) 500 MG tablet Take 1 tablet (500 mg total) by mouth 3 (three) times daily., Starting Mon 11/29/2016, Until Thu 12/09/2016, Print    ondansetron (ZOFRAN ODT) 4 MG disintegrating tablet Take 1 tablet (4 mg total) by mouth every 8 (eight) hours as needed for nausea or vomiting., Starting Mon 11/29/2016, Print        I personally performed the services described in this documentation, which was scribed in my presence. The recorded information has been reviewed and is accurate.    Gareth Morgan, MD 11/30/16 1242

## 2016-11-29 NOTE — ED Notes (Signed)
Patient transported to CT 

## 2016-11-29 NOTE — ED Triage Notes (Signed)
PREsents with left lower abdominal pain with radiation to right side as well described as cramping worse with eating anything began Saturday while at a cookout. She saw her PMD today and was RX zofran and an antibiotic told to take it easy with food and drink gatorade. With any bite of food pt endorses severe abdominal cramping and diarrhea. Nausea is currently better with the zofran. Abdomen non tender to palpation.

## 2016-11-29 NOTE — ED Notes (Signed)
ED Provider at bedside discussing test results and dispo plan of care. 

## 2016-12-02 DIAGNOSIS — I10 Essential (primary) hypertension: Secondary | ICD-10-CM | POA: Diagnosis not present

## 2016-12-02 DIAGNOSIS — F419 Anxiety disorder, unspecified: Secondary | ICD-10-CM | POA: Diagnosis not present

## 2016-12-02 DIAGNOSIS — K5792 Diverticulitis of intestine, part unspecified, without perforation or abscess without bleeding: Secondary | ICD-10-CM | POA: Diagnosis not present

## 2016-12-02 DIAGNOSIS — K219 Gastro-esophageal reflux disease without esophagitis: Secondary | ICD-10-CM | POA: Diagnosis not present

## 2016-12-06 ENCOUNTER — Encounter: Payer: Self-pay | Admitting: Internal Medicine

## 2017-01-24 ENCOUNTER — Telehealth: Payer: 59 | Admitting: Family

## 2017-01-24 DIAGNOSIS — T63481A Toxic effect of venom of other arthropod, accidental (unintentional), initial encounter: Secondary | ICD-10-CM | POA: Diagnosis not present

## 2017-01-24 MED ORDER — PREDNISONE 10 MG PO TABS: 10.0000 mg | ORAL_TABLET | ORAL | 0 refills | Status: DC

## 2017-01-24 MED FILL — predniSONE 10 MG TABS: 10 | 6 days supply | Qty: 21 | Fill #0

## 2017-01-24 NOTE — Progress Notes (Signed)
Thank you for the details you included in the comment boxes. Those details are very helpful in determining the best course of treatment for you and help [place] to provide the best care.  E Visit for Insect Sting  Thank you for describing the insect sting for [place].  Here is how we plan to help!  A sting that we will treat with a short course of prednisone.  The 2 greatest risks from insect stings are allergic reaction, which can be fatal in some people and infection, which is more common and less serious.  Bees, wasps, yellow jackets, and hornets belong to a class of insects called Hymenoptera.  Most insect stings cause only minor discomfort.  Stings can happen anywhere on the body and can be painful.  Most stings are from honey bees or yellow jackets.  Fire ants can sting multiple times.  The sites of the stings are more likely to become infected.    I have sent in prednisone 10 mg tapering dose for 5 days to the pharmacy you selected.  Please make sure that you selected a pharmacy that is open now.  What can be used to prevent Insect Stings?   Insect repellant with at least 20% DEET.    Wearing long pants and shirts with socks and shoes.    Wear dark or drab-colored clothes rather than bright colors.    Avoid using perfumes and hair sprays; these attract insects.  HOME CARE ADVICE:  1. Stinger removal:  The stinger looks like a tiny black dot in the sting.  Use a fingernail, credit card edge, or knife-edge to scrape it off.  Don't pull it out because it squeezes out more venom.  If the stinger is below the skin surface, leave it alone.  It will be shed with normal skin healing. 2. Use cold compresses to the area of the sting for 10-20 minutes.  You may repeat this as needed to relieve symptoms of pain and swelling. 3.  For pain relief, take acetominophen 650 mg 4-6 hours as needed or ibuprofen 400 mg every 6-8 hours as needed or naproxen 250-500 mg every 12 hours as needed. 4.   You can also use hydrocortisone cream 0.5% or 1% up to 4 times daily as needed for itching. 5.  If the sting becomes very itchy, take Benadryl 25-50 mg, follow directions on box. 6.  Wash the area 2-3 times daily with antibacterial soap and warm water. 7. Call your Doctor if:  Fever, a severe headache, or rash occur in the next 2 weeks.  Sting area begins to look infected.  Redness and swelling worsens after home treatment.  Your current symptoms become worse.    MAKE SURE YOU:   Understand these instructions.  Will watch your condition.  Will get help right away if you are not doing well or get worse.  Thank you for choosing an e-visit. Your e-visit answers were reviewed by a board certified advanced clinical practitioner to complete your personal care plan. Depending upon the condition, your plan could have included both over the counter or prescription medications. Please review your pharmacy choice. Be sure that the pharmacy you have chosen is open so that you can pick up your prescription now.  If there is a problem you may message your provider in [place] to have the prescription routed to another pharmacy. Your safety is important to [place]. If you have drug allergies check your prescription carefully.  For the next 24 hours, you  can use MyChart to ask questions about today's visit, request a non-urgent call back, or ask for a work or school excuse from your e-visit provider. You will get an email in the next two days asking about your experience. I hope that your e-visit has been valuable and will speed your recovery.

## 2017-01-27 ENCOUNTER — Encounter: Payer: Self-pay | Admitting: Internal Medicine

## 2017-01-27 ENCOUNTER — Telehealth: Payer: Self-pay | Admitting: Internal Medicine

## 2017-01-27 ENCOUNTER — Ambulatory Visit (INDEPENDENT_AMBULATORY_CARE_PROVIDER_SITE_OTHER): Payer: 59 | Admitting: Internal Medicine

## 2017-01-27 VITALS — BP 144/86 | HR 80 | Ht 63.0 in | Wt 193.2 lb

## 2017-01-27 DIAGNOSIS — K5732 Diverticulitis of large intestine without perforation or abscess without bleeding: Secondary | ICD-10-CM

## 2017-01-27 DIAGNOSIS — K219 Gastro-esophageal reflux disease without esophagitis: Secondary | ICD-10-CM

## 2017-01-27 DIAGNOSIS — K5900 Constipation, unspecified: Secondary | ICD-10-CM | POA: Diagnosis not present

## 2017-01-27 DIAGNOSIS — Z1211 Encounter for screening for malignant neoplasm of colon: Secondary | ICD-10-CM

## 2017-01-27 DIAGNOSIS — R935 Abnormal findings on diagnostic imaging of other abdominal regions, including retroperitoneum: Secondary | ICD-10-CM | POA: Diagnosis not present

## 2017-01-27 MED ORDER — NA SULFATE-K SULFATE-MG SULF 17.5-3.13-1.6 GM/177ML PO SOLN: 1.0000 | Freq: Once | ORAL | 0 refills | Status: AC

## 2017-01-27 MED FILL — AMLODIPINE BESYLATE 10 MG T: 10 | 90 days supply | Qty: 90 | Fill #1

## 2017-01-27 MED FILL — LOSARTAN POTASSIUM 50 MG TA: 50 | 90 days supply | Qty: 90 | Fill #1

## 2017-01-27 NOTE — Patient Instructions (Signed)

## 2017-01-27 NOTE — Progress Notes (Signed)
HISTORY OF PRESENT ILLNESS:  Mandy Riggs is a 46 y.o. 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 Antony Riggs with a chief complaint of diverticulitis and the need for colonoscopy. Patient was in her usual state of health until early August 2018 when she developed acute left lower quadrant and periumbilical abdominal pain. She went to the emergency room. I have reviewed x-rays and laboratories. CT scan of the abdomen and pelvis performed 11/29/2016 revealed mild soft tissue inflammation along the distal descending colon with associated diverticula likely reflecting mild acute diverticulitis without complicating features. Blood work at that time was unremarkable. White blood cell count was 9.6. Hemoglobin 11.7. She was treated with antibiotic therapy and improved within one week. She was subsequently seen in follow-up by her primary care physician (office evaluation from 12/02/2016 reviewed). She was doing well except for an interval [place] infection which was treated. Patient tells me that she has had no recurrent abdominal pain. Her GI review of systems is remarkable for chronic GERD for which she takes Nexium 20 mg daily. This helps. No dysphagia or other alarm features. She is overweight. She also reports chronic constipation which has worsened since she is on chronic iron therapy for anemia related to chronic menstrual blood loss and bleeding uterine fibroids. Patient denies any family history of colon cancer. She does take stool softeners for her bowels.  REVIEW OF SYSTEMS:  All non-GI ROS negative unless otherwise stated in the history of present illness except for muscle cramps  Past Medical History:  Diagnosis Date  . Acid reflux    occasionally  . Anemia   . Anxiety   . Diverticulitis   . Hypertension    states under control with meds., has been on med. x "a while", per pt.  . Mass of left axilla 06/2015  . Peptic  ulcer disease   . Sickle cell trait ([place])   . Wears partial dentures    upper    Past Surgical History:  Procedure Laterality Date  . ANAL FISTULECTOMY    . BREAST BIOPSY Right    benign core  . MASS EXCISION Left 07/21/2015   Procedure: EXCISION MASS LEFT AXILLA;  Surgeon: Mandy Seltzer, MD;  Location: [facility];  Service: General;  Laterality: Left;  . TONSILLECTOMY      Social History Mandy Riggs  reports that she has never smoked. She has never used smokeless tobacco. She reports that she does not drink alcohol or use drugs.  family history includes Colon polyps in her mother; Diabetes in her mother; Heart disease in her maternal grandmother; Hypertension in her mother; Other in her father.  Allergies  Allergen Reactions  . Latex Hives and Itching  . Ace Inhibitors Cough  . Hct [Hydrochlorothiazide] Other (See Comments)    cramping  . Hydrocodone Itching       PHYSICAL EXAMINATION: Vital signs: BP (!) 144/86   Pulse 80   Ht 5\' 3"  (1.6 m)   Wt 193 lb 4 oz (87.7 kg)   BMI 34.23 kg/m   Constitutional: generally well-appearing, no acute distress Psychiatric: alert and oriented x3, cooperative Eyes: extraocular movements intact, anicteric, conjunctiva pink Mouth: oral pharynx moist, no lesions Neck: supple no lymphadenopathy Cardiovascular: heart regular rate and rhythm, no murmur Lungs: clear to auscultation bilaterally Abdomen: soft,Obese, nontender, nondistended, no obvious ascites, no peritoneal signs, normal bowel sounds, no organomegaly Rectal:Deferred until colonoscopy Extremities: no clubbing  cyanosis or lower extremity edema bilaterally Skin: no lesions on visible extremities Neuro: No focal deficits. No asterixis.   ASSESSMENT:  #1. Acute diverticulitis. Improved after antibiotic therapy #2. Abnormal CT scan of the abdomen. Changes consistent with diverticulitis #3. Chronic constipation #4. History of anemia secondary uterine  blood loss. On chronic iron. Most recent hemoglobin reveals mild anemia as stated above #5. GERD. No alarm features. Controlled with low-dose PPI.   PLAN:  #1. Reflux precautions #2. Weight loss #3. Discussion on diverticulosis and diverticulitis. Supplemental literature also provided for her review #4. MiraLAX as needed for constipation #5. Colonoscopy to rule out other entities masquerading as diverticulitis and provide baseline colon cancer screening in this 46 year old African-American.The nature of the procedure, as well as the risks, benefits, and alternatives were carefully and thoroughly reviewed with the patient. Ample time for discussion and questions allowed. The patient understood, was satisfied, and agreed to proceed. #6. Stop iron therapy 1 week prior to colonoscopy to assist with preparation quality #7. Continue PPI to control reflux symptoms. Lowest effective dose recommended  A copy of this consultation note has been sent to Dr. Moreen Riggs

## 2017-01-31 NOTE — Telephone Encounter (Signed)
Pt is calling about her prep...it is too expensive [phone]

## 2017-01-31 NOTE — Telephone Encounter (Signed)
Left message that I would leave a sample prep up front for patient to pick up.

## 2017-02-05 ENCOUNTER — Telehealth: Payer: 59 | Admitting: Nurse Practitioner

## 2017-02-05 DIAGNOSIS — M542 Cervicalgia: Secondary | ICD-10-CM

## 2017-02-05 MED ORDER — NAPROXEN 500 MG PO TABS
500.0000 mg | ORAL_TABLET | Freq: Two times a day (BID) | ORAL | 1 refills | Status: DC
Start: 2017-02-05 — End: 2017-07-11

## 2017-02-05 MED ORDER — CYCLOBENZAPRINE HCL 10 MG PO TABS: 10.0000 mg | ORAL_TABLET | Freq: Three times a day (TID) | ORAL | 1 refills | Status: DC | PRN

## 2017-02-05 NOTE — Progress Notes (Signed)

## 2017-02-10 ENCOUNTER — Encounter: Payer: Self-pay | Admitting: Internal Medicine

## 2017-02-14 DIAGNOSIS — M542 Cervicalgia: Secondary | ICD-10-CM | POA: Diagnosis not present

## 2017-02-16 ENCOUNTER — Ambulatory Visit (INDEPENDENT_AMBULATORY_CARE_PROVIDER_SITE_OTHER): Payer: 59 | Admitting: Surgery

## 2017-02-16 ENCOUNTER — Telehealth: Payer: 59 | Admitting: Family

## 2017-02-16 DIAGNOSIS — R109 Unspecified abdominal pain: Secondary | ICD-10-CM

## 2017-02-16 DIAGNOSIS — R112 Nausea with vomiting, unspecified: Secondary | ICD-10-CM

## 2017-02-16 NOTE — Progress Notes (Signed)
Based on what you shared with me it looks like you have a serious condition that should be evaluated in a face to face office visit.  NOTE: Even if you have entered your credit card information for this eVisit, you will not be charged.   If you are having a true medical emergency please call 911.  If you need an urgent face to face visit, [facility] has four urgent care centers for your convenience.  If you need care fast and have a high deductible or no insurance consider:   [email or web address]  [phone]  [address], [address] [place], [place] [address] 8 am to 8 pm Monday-Friday 10 am to 4 pm Saturday-Sunday   The following sites will take your  insurance:    . [facility]  [phone] Get Driving Directions Find a Provider at this Location  [address] [place], [place] [address] . 10 am to 8 pm Monday-Friday . 12 pm to 8 pm Saturday-Sunday   . [facility]  [phone] Get Driving Directions Find a Provider at this Location  [address], [address] [place], [place] [address] . 8 am to 8 pm Monday-Friday . 9 am to 6 pm Saturday . 11 am to 6 pm Sunday   . [facility]  [phone] Get Driving Directions  [address] [place], [place] [address] . 8 am to 8 pm Monday-Friday . 8 am to 4 pm Saturday-Sunday   Your e-visit answers were reviewed by a board certified advanced clinical practitioner to complete your personal care plan.  Thank you for using e-Visits.  

## 2017-02-23 ENCOUNTER — Ambulatory Visit (AMBULATORY_SURGERY_CENTER): Payer: 59 | Admitting: Internal Medicine

## 2017-02-23 ENCOUNTER — Encounter: Payer: Self-pay | Admitting: Internal Medicine

## 2017-02-23 ENCOUNTER — Telehealth: Payer: 59 | Admitting: Family

## 2017-02-23 VITALS — BP 125/80 | HR 82 | Temp 97.5°F | Resp 15 | Ht 63.0 in | Wt 193.0 lb

## 2017-02-23 DIAGNOSIS — D123 Benign neoplasm of transverse colon: Secondary | ICD-10-CM

## 2017-02-23 DIAGNOSIS — D571 Sickle-cell disease without crisis: Secondary | ICD-10-CM | POA: Diagnosis not present

## 2017-02-23 DIAGNOSIS — B9789 Other viral agents as the cause of diseases classified elsewhere: Secondary | ICD-10-CM | POA: Diagnosis not present

## 2017-02-23 DIAGNOSIS — J329 Chronic sinusitis, unspecified: Secondary | ICD-10-CM | POA: Diagnosis not present

## 2017-02-23 DIAGNOSIS — Z1212 Encounter for screening for malignant neoplasm of rectum: Secondary | ICD-10-CM | POA: Diagnosis not present

## 2017-02-23 DIAGNOSIS — K5730 Diverticulosis of large intestine without perforation or abscess without bleeding: Secondary | ICD-10-CM | POA: Diagnosis not present

## 2017-02-23 DIAGNOSIS — I10 Essential (primary) hypertension: Secondary | ICD-10-CM | POA: Diagnosis not present

## 2017-02-23 DIAGNOSIS — Z1211 Encounter for screening for malignant neoplasm of colon: Secondary | ICD-10-CM

## 2017-02-23 DIAGNOSIS — K219 Gastro-esophageal reflux disease without esophagitis: Secondary | ICD-10-CM | POA: Diagnosis not present

## 2017-02-23 DIAGNOSIS — K5732 Diverticulitis of large intestine without perforation or abscess without bleeding: Secondary | ICD-10-CM

## 2017-02-23 MED ORDER — FLUTICASONE PROPIONATE 50 MCG/ACT NA SUSP: 2.0000 | Freq: Every day | NASAL | 6 refills | Status: DC

## 2017-02-23 MED ORDER — SODIUM CHLORIDE 0.9 % IV SOLN: 500.0000 mL | INTRAVENOUS | Status: AC

## 2017-02-23 MED FILL — FLUTICASONE PROP 50 MCG SPR: 50 | 30 days supply | Qty: 16 | Fill #0

## 2017-02-23 NOTE — Progress Notes (Signed)
Called to room to assist during endoscopic procedure.  Patient ID and intended procedure confirmed with present staff. Received instructions for my participation in the procedure from the performing physician. maw 

## 2017-02-23 NOTE — Progress Notes (Signed)

## 2017-02-23 NOTE — Patient Instructions (Signed)
YOU HAD AN ENDOSCOPIC PROCEDURE TODAY AT THE [facility]:   Refer to the procedure report that was given to you for any specific questions about what was found during the examination.  If the procedure report does not answer your questions, please call your gastroenterologist to clarify.  If you requested that your care partner not be given the details of your procedure findings, then the procedure report has been included in a sealed envelope for you to review at your convenience later.  YOU SHOULD EXPECT: Some feelings of bloating in the abdomen. Passage of more gas than usual.  Walking can help get rid of the air that was put into your GI tract during the procedure and reduce the bloating. If you had a lower endoscopy (such as a colonoscopy or flexible sigmoidoscopy) you may notice spotting of blood in your stool or on the toilet paper. If you underwent a bowel prep for your procedure, you may not have a normal bowel movement for a few days.  Please Note:  You might notice some irritation and congestion in your nose or some drainage.  This is from the oxygen used during your procedure.  There is no need for concern and it should clear up in a day or so.  SYMPTOMS TO REPORT IMMEDIATELY:   Following lower endoscopy (colonoscopy or flexible sigmoidoscopy):  Excessive amounts of blood in the stool  Significant tenderness or worsening of abdominal pains  Swelling of the abdomen that is new, acute  Fever of 100F or higher   Following upper endoscopy (EGD)  Vomiting of blood or coffee ground material  New chest pain or pain under the shoulder blades  Painful or persistently difficult swallowing  New shortness of breath  Fever of 100F or higher  Black, tarry-looking stools  For urgent or emergent issues, a gastroenterologist can be reached at any hour by calling [phone].   DIET:  We do recommend a small meal at first, but then you may proceed to your regular diet.  Drink  plenty of fluids but you should avoid alcoholic beverages for 24 hours.  ACTIVITY:  You should plan to take it easy for the rest of today and you should NOT DRIVE or use heavy machinery until tomorrow (because of the sedation medicines used during the test).    FOLLOW UP: Our staff will call the number listed on your records the next business day following your procedure to check on you and address any questions or concerns that you may have regarding the information given to you following your procedure. If we do not reach you, we will leave a message.  However, if you are feeling well and you are not experiencing any problems, there is no need to return our call.  We will assume that you have returned to your regular daily activities without incident.  If any biopsies were taken you will be contacted by phone or by letter within the next 1-3 weeks.  Please call us at [phone] if you have not heard about the biopsies in 3 weeks.    SIGNATURES/CONFIDENTIALITY: You and/or your care partner have signed paperwork which will be entered into your electronic medical record.  These signatures attest to the fact that that the information above on your After Visit Summary has been reviewed and is understood.  Full responsibility of the confidentiality of this discharge information lies with you and/or your care-partner.  Polyp, diverticulosis, high fiber diet and hemorrhoid information given. 

## 2017-02-23 NOTE — Progress Notes (Signed)
Report to PACU, RN, vss, BBS= Clear.  

## 2017-02-23 NOTE — Op Note (Signed)
[facility] Patient Name: Mandy Riggs Procedure Date: 02/23/2017 2:13 PM MRN: [id number] Endoscopist: Docia Chuck. Henrene Pastor , MD Age: 46 Referring MD:  Date of Birth: [birth year] Gender: Female Account #: [id number] Procedure:                Colonoscopy, with cold snare polypectomy x 1 Indications:              Screening for colorectal malignant neoplasm Medicines:                Monitored Anesthesia Care Procedure:                Pre-Anesthesia Assessment:                           - Prior to the procedure, a History and Physical                            was performed, and patient medications and                            allergies were reviewed. The patient's tolerance of                            previous anesthesia was also reviewed. The risks                            and benefits of the procedure and the sedation                            options and risks were discussed with the patient.                            All questions were answered, and informed consent                            was obtained. Prior Anticoagulants: The patient has                            taken no previous anticoagulant or antiplatelet                            agents. ASA Grade Assessment: II - A patient with                            mild systemic disease. After reviewing the risks                            and benefits, the patient was deemed in                            satisfactory condition to undergo the procedure.                           After obtaining informed consent, the colonoscope  was passed under direct vision. Throughout the                            procedure, the patient's blood pressure, pulse, and                            oxygen saturations were monitored continuously. The                            Model CF-HQ190L [id number]) scope was introduced                            through the anus and advanced to the the cecum,                  identified by appendiceal orifice and ileocecal                            valve. The ileocecal valve, appendiceal orifice,                            and rectum were photographed. The quality of the                            bowel preparation was excellent. The colonoscopy                            was performed without difficulty. The patient                            tolerated the procedure well. The bowel preparation                            used was SUPREP. Scope In: 2:24:01 PM Scope Out: 2:37:22 PM Scope Withdrawal Time: 0 hours 11 minutes 24 seconds  Total Procedure Duration: 0 hours 13 minutes 21 seconds  Findings:                 A 4 mm polyp was found in the transverse colon. The                            polyp was removed with a cold snare. Resection and                            retrieval were complete.                           Multiple diverticula were found in the left colon                            and right colon.                           Internal hemorrhoids were found during retroflexion.  The exam was otherwise without abnormality on                            direct and retroflexion views. Complications:            No immediate complications. Estimated blood loss:                            None. Estimated Blood Loss:     Estimated blood loss: none. Impression:               - One 4 mm polyp in the transverse colon, removed                            with a cold snare. Resected and retrieved.                           - Diverticulosis in the left colon and in the right                            colon.                           - Internal hemorrhoids.                           - The examination was otherwise normal on direct                            and retroflexion views. Recommendation:           - Repeat colonoscopy in 5-10 years for surveillance.                           - Patient has a contact number  available for                            emergencies. The signs and symptoms of potential                            delayed complications were discussed with the                            patient. Return to normal activities tomorrow.                            Written discharge instructions were provided to the                            patient.                           - Resume previous diet.                           - Continue present medications. Docia Chuck. Henrene Pastor, MD 02/23/2017 2:41:08 PM This report has been signed electronically.

## 2017-02-24 ENCOUNTER — Telehealth: Payer: Self-pay

## 2017-02-24 NOTE — Telephone Encounter (Signed)
Number identifier, left a message. 

## 2017-02-24 NOTE — Telephone Encounter (Signed)
Attempted to reach pt. With follow up call following endoscopic procedure on 02/23/2017.  LM on pt. Voice mail to call if she has any questions or concerns re: her procedure.

## 2017-02-27 ENCOUNTER — Telehealth: Payer: 59 | Admitting: Family

## 2017-02-27 ENCOUNTER — Other Ambulatory Visit: Payer: Self-pay | Admitting: Family

## 2017-02-27 DIAGNOSIS — B9689 Other specified bacterial agents as the cause of diseases classified elsewhere: Secondary | ICD-10-CM

## 2017-02-27 DIAGNOSIS — J028 Acute pharyngitis due to other specified organisms: Secondary | ICD-10-CM | POA: Diagnosis not present

## 2017-02-27 MED ORDER — BENZONATATE 100 MG PO CAPS: 100.0000 mg | ORAL_CAPSULE | Freq: Three times a day (TID) | ORAL | 0 refills | Status: DC | PRN

## 2017-02-27 MED ORDER — AZITHROMYCIN 250 MG PO TABS: ORAL_TABLET | ORAL | 0 refills | Status: DC

## 2017-02-27 NOTE — Progress Notes (Signed)

## 2017-02-28 ENCOUNTER — Encounter: Payer: Self-pay | Admitting: Internal Medicine

## 2017-04-20 DIAGNOSIS — M5412 Radiculopathy, cervical region: Secondary | ICD-10-CM | POA: Diagnosis not present

## 2017-04-27 DIAGNOSIS — I10 Essential (primary) hypertension: Secondary | ICD-10-CM | POA: Diagnosis not present

## 2017-04-27 DIAGNOSIS — G5601 Carpal tunnel syndrome, right upper limb: Secondary | ICD-10-CM | POA: Diagnosis not present

## 2017-04-27 DIAGNOSIS — Z8719 Personal history of other diseases of the digestive system: Secondary | ICD-10-CM | POA: Diagnosis not present

## 2017-05-30 ENCOUNTER — Telehealth: Payer: Self-pay | Admitting: Internal Medicine

## 2017-05-30 MED ORDER — METRONIDAZOLE 500 MG PO TABS: 500.0000 mg | ORAL_TABLET | Freq: Two times a day (BID) | ORAL | 0 refills | Status: DC

## 2017-05-30 MED ORDER — CIPROFLOXACIN HCL 500 MG PO TABS: 500.0000 mg | ORAL_TABLET | Freq: Two times a day (BID) | ORAL | 0 refills | Status: DC

## 2017-05-30 MED FILL — CIPROFLOXACIN HCL 500 MG TA: 500 | 14 days supply | Qty: 28 | Fill #0

## 2017-05-30 MED FILL — AMLODIPINE BESYLATE 10 MG T: 10 | 90 days supply | Qty: 90 | Fill #2

## 2017-05-30 MED FILL — metroNIDAZOLE 500 MG TABS: 500 | 14 days supply | Qty: 28 | Fill #0

## 2017-05-30 MED FILL — LOSARTAN POTASSIUM 50 MG TA: 50 | 90 days supply | Qty: 90 | Fill #2

## 2017-05-30 NOTE — Telephone Encounter (Signed)
Mandy Riggs pt calling stating that she thinks she is having a diverticulitis flare. Pt was seen in the ER in August and diagnosed with diverticulitis, treated with cipro and flagyl. Pt had a colon done in October. States she started having pain today in her LLQ along with cramping and some nausea. States it feels like she needs to go to the bathroom but she can't. Requesting something be called in for diverticulitis. Dr. Fuller Plan as DOD please advise.

## 2017-05-30 NOTE — Telephone Encounter (Signed)
Spoke with pt and she is aware. Scripts sent to pharmacy. Pt scheduled to see Dr. Henrene Pastor 07/11/17@8 :30am.

## 2017-05-30 NOTE — Telephone Encounter (Signed)
Left message for pt to call back  °

## 2017-05-30 NOTE — Telephone Encounter (Signed)
Cipro 500 mg po bid, #14 Metronidazole 500 mg po bid, #14 Low residue diet for several days and advance as tolerated REV with Dr. Henrene Pastor in 1 month

## 2017-06-02 ENCOUNTER — Other Ambulatory Visit: Payer: Self-pay | Admitting: Family Medicine

## 2017-06-02 DIAGNOSIS — Z1231 Encounter for screening mammogram for malignant neoplasm of breast: Secondary | ICD-10-CM

## 2017-06-22 ENCOUNTER — Other Ambulatory Visit: Payer: Self-pay | Admitting: *Deleted

## 2017-06-22 DIAGNOSIS — G5601 Carpal tunnel syndrome, right upper limb: Secondary | ICD-10-CM

## 2017-06-23 ENCOUNTER — Ambulatory Visit (INDEPENDENT_AMBULATORY_CARE_PROVIDER_SITE_OTHER): Payer: 59 | Admitting: Neurology

## 2017-06-23 DIAGNOSIS — G5601 Carpal tunnel syndrome, right upper limb: Secondary | ICD-10-CM

## 2017-06-23 NOTE — Procedures (Signed)
[facility]  [address], [address]  [place], [place] [address] Tel: [phone] Fax:  [phone] Test Date:  06/23/2017  Patient: Mandy Riggs DOB: Jul 16, 1970 Physician: Narda Amber, DO  Sex: Female Height: 5\' 3"  Ref Phys: Antony Contras, MD  ID#: [id number] Temp: 37.0C Technician:    Patient Complaints: This is a 47 year old female referred for evaluation of right hand numbness.  NCV & EMG Findings: Electrodiagnostic testing of the right upper extremity shows:  1. Right median sensory response shows prolonged distal peak latency (5.0 ms) and reduced amplitude (9.5 V). Right ulnar sensory response is within normal limits. 2. Right median motor response shows prolonged distal onset latency (5.2 ms) with normal amplitude. Right ulnar motor responses within normal limits. 3. There is no evidence of active or chronic motor axon loss changes affecting any of the tested muscles. Motor unit configuration and recruitment pattern is within normal limits.  Impression: Right median neuropathy at or distal to the wrist, consistent with the clinical diagnosis of carpal tunnel syndrome. Overall, these findings are moderate in degree electrically.   ___________________________ Narda Amber, DO    Nerve Conduction Studies Anti Sensory Summary Table   Site NR Peak (ms) Norm Peak (ms) P-T Amp (V) Norm P-T Amp  Right Median Anti Sensory (2nd Digit)  37C  Wrist    5.0 <3.4 9.5 >20  Right Ulnar Anti Sensory (5th Digit)  37C  Wrist    2.4 <3.1 31.5 >12   Motor Summary Table   Site NR Onset (ms) Norm Onset (ms) O-P Amp (mV) Norm O-P Amp Site1 Site2 Delta-0 (ms) Dist (cm) Vel (m/s) Norm Vel (m/s)  Right Median Motor (Abd Poll Brev)  37C  Wrist    5.2 <3.9 8.5 >6 Elbow Wrist 5.3 30.0 57 >50  Elbow    10.5  8.3         Right Ulnar Motor (Abd Dig Minimi)  37C  Wrist    2.1 <3.1 8.0 >7 B Elbow Wrist 3.9 23.0 59 >50  B Elbow    6.0  7.2  A Elbow B Elbow 2.0 10.0 50 >50  A  Elbow    8.0  6.9          EMG   Side Muscle Ins Act Fibs Psw Fasc Number Recrt Dur Dur. Amp Amp. Poly Poly. Comment  Right 1stDorInt Nml Nml Nml Nml Nml Nml Nml Nml Nml Nml Nml Nml N/A  Right Abd Poll Brev Nml Nml Nml Nml Nml Nml Nml Nml Nml Nml Nml Nml N/A  Right PronatorTeres Nml Nml Nml Nml Nml Nml Nml Nml Nml Nml Nml Nml N/A  Right Biceps Nml Nml Nml Nml Nml Nml Nml Nml Nml Nml Nml Nml N/A  Right Triceps Nml Nml Nml Nml Nml Nml Nml Nml Nml Nml Nml Nml N/A  Right Deltoid Nml Nml Nml Nml Nml Nml Nml Nml Nml Nml Nml Nml N/A      Waveforms:

## 2017-07-11 ENCOUNTER — Ambulatory Visit: Payer: 59 | Admitting: Internal Medicine

## 2017-07-11 ENCOUNTER — Encounter: Payer: Self-pay | Admitting: Internal Medicine

## 2017-07-11 VITALS — BP 118/84 | HR 70 | Ht 63.0 in | Wt 183.0 lb

## 2017-07-11 DIAGNOSIS — D123 Benign neoplasm of transverse colon: Secondary | ICD-10-CM

## 2017-07-11 DIAGNOSIS — K5732 Diverticulitis of large intestine without perforation or abscess without bleeding: Secondary | ICD-10-CM | POA: Diagnosis not present

## 2017-07-11 DIAGNOSIS — K5900 Constipation, unspecified: Secondary | ICD-10-CM

## 2017-07-11 DIAGNOSIS — K219 Gastro-esophageal reflux disease without esophagitis: Secondary | ICD-10-CM

## 2017-07-11 NOTE — Progress Notes (Signed)
HISTORY OF PRESENT ILLNESS:  Mandy Riggs is a 47 y.o. 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 See that dictation. She subsequently underwent complete colonoscopy 02/23/2017. She was found to have pandiverticulosis, internal hemorrhoids, and a diminutive tubular adenoma for which colonoscopy follow-up in 5 years recommended. She contacted the office last month with complaints of left lower quadrant pain consistent with her previous bout of diverticulitis. She was treated with ciprofloxacin and metronidazole. Her pain has resolved. She does continue with occasional constipation. She has been on MiraLAX which has resulted in more loose bowel movements. No additional complaints.  REVIEW OF SYSTEMS:  All non-GI ROS negativeunless otherwise stated in the history of present illness except for anxiety  Past Medical History:  Diagnosis Date  . Acid reflux    occasionally  . Anemia   . Anxiety   . Diverticulitis   . Hypertension    states under control with meds., has been on med. x "a while", per pt.  . Mass of left axilla 06/2015  . Peptic ulcer disease   . Sickle cell trait ([place])   . Wears partial dentures    upper    Past Surgical History:  Procedure Laterality Date  . ANAL FISTULECTOMY    . BREAST BIOPSY Right    benign core  . MASS EXCISION Left 07/21/2015   Procedure: EXCISION MASS LEFT AXILLA;  Surgeon: Excell Seltzer, MD;  Location: [facility];  Service: General;  Laterality: Left;  . TONSILLECTOMY      Social History NYEEMA WANT  reports that  has never smoked. she has never used smokeless tobacco. She reports that she does not drink alcohol or use drugs.  family history includes Colon polyps in her mother; Diabetes in her mother; Heart disease in her maternal grandmother; Hypertension in her mother; Other in her father.  Allergies  Allergen Reactions   . Latex Hives and Itching  . Ace Inhibitors Cough  . Hct [Hydrochlorothiazide] Other (See Comments)    cramping  . Hydrocodone Itching       PHYSICAL EXAMINATION: Vital signs: BP 118/84   Pulse 70   Ht 5\' 3"  (1.6 m)   Wt 183 lb (83 kg)   BMI 32.42 kg/m   Constitutional: generally well-appearing, no acute distress Psychiatric: alert and oriented x3, cooperative Eyes: extraocular movements intact, anicteric, conjunctiva pink Mouth: oral pharynx moist, no lesions Neck: supple no lymphadenopathy Cardiovascular: heart regular rate and rhythm, no murmur Lungs: clear to auscultation bilaterally Abdomen: soft, nontender, nondistended, no obvious ascites, no peritoneal signs, normal bowel sounds, no organomegaly Rectal:omitted Extremities: no clubbing, cyanosis, or lower extremity edema bilaterally Skin: no lesions on visible extremities Neuro: No focal deficits. Cranial nerves intact  ASSESSMENT:  #1. Recurrent acute diverticulitis responding to antibiotic therapy. #2. Chronic constipation #3. History of adenomatous colon polyps on colonoscopy October 2018 #4. GERD. Treated with on demand PPI   PLAN:  #1. Daily fiber supplementation with Metamucil #2. Back off on MiraLAX if this results in diarrhea #3. Reflux precautions #4. Surveillance colonoscopy around October 2023 #5. Resume general medical care with PCP. Interval GI follow-up as needed.  25 minutes spent face-to-face with the patient. Greater than 50% a time used for counseling regarding her recurrent diverticulitis and abnormal bowel habits

## 2017-07-11 NOTE — Patient Instructions (Signed)
Please begin using 2 tablespoons of Metamucil daily

## 2017-07-18 ENCOUNTER — Ambulatory Visit
Admission: RE | Admit: 2017-07-18 | Discharge: 2017-07-18 | Disposition: A | Discharge status: Home / Self Care | Payer: 59 | Source: Ambulatory Visit | Attending: Family Medicine | Admitting: Family Medicine

## 2017-07-18 DIAGNOSIS — Z1231 Encounter for screening mammogram for malignant neoplasm of breast: Secondary | ICD-10-CM

## 2017-08-05 DIAGNOSIS — G5601 Carpal tunnel syndrome, right upper limb: Secondary | ICD-10-CM | POA: Diagnosis not present

## 2017-08-19 DIAGNOSIS — M545 Low back pain: Secondary | ICD-10-CM | POA: Diagnosis not present

## 2017-08-19 MED FILL — CYCLOBENZAPRINE 10 MG TAB: 10 | 5 days supply | Qty: 15 | Fill #0

## 2017-08-19 MED FILL — OXYCODONE-ACETAMINOPHEN 5-3: 5-325 | 5 days supply | Qty: 15 | Fill #0

## 2017-08-31 DIAGNOSIS — M545 Low back pain: Secondary | ICD-10-CM | POA: Diagnosis not present

## 2017-08-31 DIAGNOSIS — J302 Other seasonal allergic rhinitis: Secondary | ICD-10-CM | POA: Diagnosis not present

## 2017-08-31 DIAGNOSIS — S335XXA Sprain of ligaments of lumbar spine, initial encounter: Secondary | ICD-10-CM | POA: Diagnosis not present

## 2017-08-31 MED FILL — NAPROXEN 500 MG TABLET: 500 | 30 days supply | Qty: 60 | Fill #0

## 2017-10-31 DIAGNOSIS — D259 Leiomyoma of uterus, unspecified: Secondary | ICD-10-CM | POA: Diagnosis not present

## 2017-10-31 DIAGNOSIS — Z8719 Personal history of other diseases of the digestive system: Secondary | ICD-10-CM | POA: Diagnosis not present

## 2017-10-31 DIAGNOSIS — J302 Other seasonal allergic rhinitis: Secondary | ICD-10-CM | POA: Diagnosis not present

## 2017-10-31 DIAGNOSIS — K219 Gastro-esophageal reflux disease without esophagitis: Secondary | ICD-10-CM | POA: Diagnosis not present

## 2017-10-31 DIAGNOSIS — Z862 Personal history of diseases of the blood and blood-forming organs and certain disorders involving the immune mechanism: Secondary | ICD-10-CM | POA: Diagnosis not present

## 2017-10-31 DIAGNOSIS — E559 Vitamin D deficiency, unspecified: Secondary | ICD-10-CM | POA: Diagnosis not present

## 2017-10-31 DIAGNOSIS — I10 Essential (primary) hypertension: Secondary | ICD-10-CM | POA: Diagnosis not present

## 2017-10-31 DIAGNOSIS — Z0000 Encounter for general adult medical examination without abnormal findings: Secondary | ICD-10-CM | POA: Diagnosis not present

## 2017-10-31 DIAGNOSIS — F419 Anxiety disorder, unspecified: Secondary | ICD-10-CM | POA: Diagnosis not present

## 2017-10-31 MED FILL — FLUTICASONE PROP 50 MCG SPR: 50 | 30 days supply | Qty: 16 | Fill #0

## 2017-10-31 MED FILL — LEVOCETIRIZINE 5 MG TABLET: 5 | 30 days supply | Qty: 30 | Fill #0

## 2017-10-31 MED FILL — AMLODIPINE BESYLATE 10 MG T: 10 | 90 days supply | Qty: 90 | Fill #0

## 2017-10-31 MED FILL — LOSARTAN POTASSIUM 50 MG TA: 50 | 90 days supply | Qty: 90 | Fill #0

## 2017-11-14 ENCOUNTER — Ambulatory Visit (INDEPENDENT_AMBULATORY_CARE_PROVIDER_SITE_OTHER): Payer: Self-pay | Admitting: Nurse Practitioner

## 2017-11-14 ENCOUNTER — Encounter: Payer: Self-pay | Admitting: Nurse Practitioner

## 2017-11-14 VITALS — BP 140/84 | HR 82 | Temp 98.4°F | Wt 191.2 lb

## 2017-11-14 DIAGNOSIS — M25552 Pain in left hip: Secondary | ICD-10-CM

## 2017-11-14 MED ORDER — MELOXICAM 7.5 MG PO TABS: 7.5000 mg | ORAL_TABLET | Freq: Every day | ORAL | 0 refills | Status: AC

## 2017-11-14 NOTE — Patient Instructions (Signed)
Hip Pain  Switch to heat at least twice daily for hip pain. Perform stretching exercises provided. Follow up with PCP if pain does not improve.  The hip is the joint between the upper legs and the lower pelvis. The bones, cartilage, tendons, and muscles of your hip joint support your body and allow you to move around. Hip pain can range from a minor ache to severe pain in one or both of your hips. The pain may be felt on the inside of the hip joint near the groin, or the outside near the buttocks and upper thigh. You may also have swelling or stiffness. Follow these instructions at home: Managing pain, stiffness, and swelling  If directed, apply ice to the injured area. ? Put ice in a plastic bag. ? Place a towel between your skin and the bag. ? Leave the ice on for 20 minutes, 2-3 times a day  Sleep with a pillow between your legs on your most comfortable side.  Avoid any activities that cause pain. General instructions  Take over-the-counter and prescription medicines only as told by your health care provider.  Do any exercises as told by your health care provider.  Record the following: ? How often you have hip pain. ? The location of your pain. ? What the pain feels like. ? What makes the pain worse.  Keep all follow-up visits as told by your health care provider. This is important. Contact a health care provider if:  You cannot put weight on your leg.  Your pain or swelling continues or gets worse after one week.  It gets harder to walk.  You have a fever. Get help right away if:  You fall.  You have a sudden increase in pain and swelling in your hip.  Your hip is red or swollen or very tender to touch. Summary  Hip pain can range from a minor ache to severe pain in one or both of your hips.  The pain may be felt on the inside of the hip joint near the groin, or the outside near the buttocks and upper thigh.  Avoid any activities that cause pain.  Record  how often you have hip pain, the location of the pain, what makes it worse and what it feels like. This information is not intended to replace advice given to you by your health care provider. Make sure you discuss any questions you have with your health care provider. Document Released: 09/30/2009 Document Revised: 03/15/2016 Document Reviewed: 03/15/2016 Elsevier Interactive Patient Education  2018 [employer].  Hip Exercises Ask your health care provider which exercises are safe for you. Do exercises exactly as told by your health care provider and adjust them as directed. It is normal to feel mild stretching, pulling, tightness, or discomfort as you do these exercises, but you should stop right away if you feel sudden pain or your pain gets worse.Do not begin these exercises until told by your health care provider. STRETCHING AND RANGE OF MOTION EXERCISES These exercises warm up your muscles and joints and improve the movement and flexibility of your hip. These exercises also help to relieve pain, numbness, and tingling. Exercise A: Hamstrings, Supine  1. Lie on your back. 2. Loop a belt or towel over the ball of your left / rightfoot. The ball of your foot is on the walking surface, right under your toes. 3. Straighten your left / rightknee and slowly pull on the belt to raise your leg. ? Do not  let your left / right knee bend while you do this. ? Keep your other leg flat on the floor. ? Raise the left / right leg until you feel a gentle stretch behind your left / right knee or thigh. 4. Hold this position for __________ seconds. 5. Slowly return your leg to the starting position. Repeat __________ times. Complete this stretch __________ times a day. Exercise B: Hip Rotators  1. Lie on your back on a firm surface. 2. Hold your left / right knee with your left / right hand. Hold your ankle with your other hand. 3. Gently pull your left / right knee and rotate your lower leg toward  your other shoulder. ? Pull until you feel a stretch in your buttocks. ? Keep your hips and shoulders firmly planted while you do this stretch. 4. Hold this position for __________ seconds. Repeat __________ times. Complete this stretch __________ times a day. Exercise C: V-Sit (Hamstrings and Adductors)  1. Sit on the floor with your legs extended in a large "V" shape. Keep your knees straight during this exercise. 2. Start with your head and chest upright, then bend at your waist to reach for your left foot (position A). You should feel a stretch in your right inner thigh. 3. Hold this position for __________ seconds. Then slowly return to the upright position. 4. Bend at your waist to reach forward (position B). You should feel a stretch behind both of your thighs and knees. 5. Hold this position for __________ seconds. Then slowly return to the upright position. 6. Bend at your waist to reach for your right foot (position C). You should feel a stretch in your left inner thigh. 7. Hold this position for __________ seconds. Then slowly return to the upright position. Repeat __________ times. Complete this stretch __________ times a day. Exercise D: Lunge (Hip Flexors)  1. Place your left / right knee on the floor and bend your other knee so that is directly over your ankle. You should be half-kneeling. 2. Keep good posture with your head over your shoulders. 3. Tighten your buttocks to point your tailbone downward. This helps your back to keep from arching too much. 4. You should feel a gentle stretch in the front of your left / right thigh and hip. If you do not feel any resistance, slightly slide your other foot forward and then slowly lunge forward so your knee once again lines up over your ankle. 5. Make sure your tailbone continues to point downward. 6. Hold this position for __________ seconds. Repeat __________ times. Complete this stretch __________ times a day. STRENGTHENING  EXERCISES These exercises build strength and endurance in your hip. Endurance is the ability to use your muscles for a long time, even after they get tired. Exercise E: Bridge (Hip Extensors)  1. Lie on your back on a firm surface with your knees bent and your feet flat on the floor. 2. Tighten your buttocks muscles and lift your bottom off the floor until the trunk of your body is level with your thighs. ? Do not arch your back. ? You should feel the muscles working in your buttocks and the back of your thighs. If you do not feel these muscles, slide your feet 1-2 inches (2.5-5 cm) farther away from your buttocks. 3. Hold this position for __________ seconds. 4. Slowly lower your hips to the starting position. 5. Let your muscles relax completely between repetitions. 6. If this exercise is too easy, try doing  it with your arms crossed over your chest. Repeat __________ times. Complete this exercise __________ times a day. Exercise F: Straight Leg Raises - Hip Abductors  1. Lie on your side with your left / right leg in the top position. Lie so your head, shoulder, knee, and hip line up with each other. You may bend your bottom knee to help you balance. 2. Roll your hips slightly forward, so your hips are stacked directly over each other and your left / right knee is facing forward. 3. Leading with your heel, lift your top leg 4-6 inches (10-15 cm). You should feel the muscles in your outer hip lifting. ? Do not let your foot drift forward. ? Do not let your knee roll toward the ceiling. 4. Hold this position for __________ seconds. 5. Slowly return to the starting position. 6. Let your muscles relax completely between repetitions. Repeat __________ times. Complete this exercise __________ times a day. Exercise G: Straight Leg Raises - Hip Adductors  1. Lie on your side with your left / right leg in the bottom position. Lie so your head, shoulder, knee, and hip line up. You may place your  upper foot in front to help you balance. 2. Roll your hips slightly forward, so your hips are stacked directly over each other and your left / right knee is facing forward. 3. Tense the muscles in your inner thigh and lift your bottom leg 4-6 inches (10-15 cm). 4. Hold this position for __________ seconds. 5. Slowly return to the starting position. 6. Let your muscles relax completely between repetitions. Repeat __________ times. Complete this exercise __________ times a day. Exercise H: Straight Leg Raises - Quadriceps  1. Lie on your back with your left / right leg extended and your other knee bent. 2. Tense the muscles in the front of your left / right thigh. When you do this, you should see your kneecap slide up or see increased dimpling just above your knee. 3. Tighten these muscles even more and raise your leg 4-6 inches (10-15 cm) off the floor. 4. Hold this position for __________ seconds. 5. Keep these muscles tense as you lower your leg. 6. Relax the muscles slowly and completely between repetitions. Repeat __________ times. Complete this exercise __________ times a day. Exercise I: Hip Abductors, Standing 1. Tie one end of a rubber exercise band or tubing to a secure surface, such as a table or pole. 2. Loop the other end of the band or tubing around your left / right ankle. 3. Keeping your ankle with the band or tubing directly opposite of the secured end, step away until there is tension in the tubing or band. Hold onto a chair as needed for balance. 4. Lift your left / right leg out to your side. While you do this: ? Keep your back upright. ? Keep your shoulders over your hips. ? Keep your toes pointing forward. ? Make sure to use your hip muscles to lift your leg. Do not "throw" your leg or tip your body to lift your leg. 5. Hold this position for __________ seconds. 6. Slowly return to the starting position. Repeat __________ times. Complete this exercise __________ times a  day. Exercise J: Squats (Quadriceps) 1. Stand in a door frame so your feet and knees are in line with the frame. You may place your hands on the frame for balance. 2. Slowly bend your knees and lower your hips like you are going to sit in a chair. ?  Keep your lower legs in a straight-up-and-down position. ? Do not let your hips go lower than your knees. ? Do not bend your knees lower than told by your health care provider. ? If your hip pain increases, do not bend as low. 3. Hold this position for ___________ seconds. 4. Slowly push with your legs to return to standing. Do not use your hands to pull yourself to standing. Repeat __________ times. Complete this exercise __________ times a day. This information is not intended to replace advice given to you by your health care provider. Make sure you discuss any questions you have with your health care provider. Document Released: 04/30/2005 Document Revised: 01/05/2016 Document Reviewed: 04/07/2015 Elsevier Interactive Patient Education  [employer].

## 2017-11-14 NOTE — Progress Notes (Signed)
Subjective:    Mandy Riggs is a 47 y.o. female who presents with left hip pain. Onset of the symptoms was a week ago. Inciting event: none. The patient reports the hip pain is worse with weight bearing, is aggravated by walking and radiates to left hamstring.  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  Aggravating symptoms include: any weight bearing, going up and down stairs, rising after sitting and walking. Patient has had no prior hip problems. Previous visits for this problem: none. Evaluation to date: none. Treatment to date: OTC analgesics, which have been somewhat effective.  The following portions of the patient's history were reviewed and updated as appropriate: allergies, current medications and past medical history.   Review of Systems Constitutional: negative Ears, nose, mouth, throat, and face: negative Respiratory: negative Cardiovascular: negative Musculoskeletal:positive for left hip pain, negative for back pain and stiff joints Neurological: negative   Objective:    BP 140/84   Pulse 82   Temp 98.4 F (36.9 C)   Wt 191 lb 3.2 oz (86.7 kg)   SpO2 99%   BMI 33.87 kg/m  Right hip: normal  Left hip: positives: pain with rotation and negatives: no tenderness no pain with heel impact    Assessment:    Left Hip Pain    Plan:   Exam findings, diagnosis etiology and medication use and indications reviewed with patient. Follow- Up and discharge instructions provided. No emergent/urgent issues found on exam.  Patient verbalized understanding of information provided and agrees with plan of care (POC), all questions answered.  1. Left hip pain  - meloxicam (MOBIC) 7.5 MG tablet; Take 1 tablet (7.5 mg total) by mouth daily.  Dispense: 30 tablet; Refill: 0 -Switch to heat at least twice daily for hip pain. -Perform stretching exercises provided. -Follow up with PCP if pain does not improve.

## 2017-11-17 ENCOUNTER — Telehealth: Payer: Self-pay

## 2017-11-17 NOTE — Telephone Encounter (Signed)
I left a message to the patients asking to call us back.

## 2017-11-17 NOTE — Telephone Encounter (Signed)
Patient called back and states her hips still hurts and she has a follow up appoinment with her PCP tomorrow November 18, 2017.

## 2017-11-18 DIAGNOSIS — M25552 Pain in left hip: Secondary | ICD-10-CM | POA: Diagnosis not present

## 2017-11-18 MED FILL — predniSONE 20 MG TABS: 20 | 7 days supply | Qty: 15 | Fill #0

## 2017-11-24 ENCOUNTER — Ambulatory Visit
Admission: RE | Admit: 2017-11-24 | Discharge: 2017-11-24 | Disposition: A | Discharge status: Home / Self Care | Payer: 59 | Source: Ambulatory Visit | Attending: Family Medicine | Admitting: Family Medicine

## 2017-11-24 ENCOUNTER — Other Ambulatory Visit: Payer: Self-pay | Admitting: Family Medicine

## 2017-11-24 DIAGNOSIS — M25552 Pain in left hip: Secondary | ICD-10-CM | POA: Diagnosis not present

## 2017-12-05 DIAGNOSIS — M25552 Pain in left hip: Secondary | ICD-10-CM | POA: Diagnosis not present

## 2017-12-05 MED FILL — predniSONE 20 MG TABS: 20 | 9 days supply | Qty: 18 | Fill #0

## 2017-12-13 DIAGNOSIS — M7062 Trochanteric bursitis, left hip: Secondary | ICD-10-CM | POA: Diagnosis not present

## 2017-12-13 DIAGNOSIS — M25552 Pain in left hip: Secondary | ICD-10-CM | POA: Diagnosis not present

## 2017-12-16 MED FILL — traMADol HCL 50 MG TABS: 50 | 15 days supply | Qty: 60 | Fill #0

## 2018-01-27 ENCOUNTER — Ambulatory Visit (INDEPENDENT_AMBULATORY_CARE_PROVIDER_SITE_OTHER): Payer: Self-pay | Admitting: Obstetrics and Gynecology

## 2018-01-27 VITALS — BP 130/82 | HR 81 | Temp 99.2°F | Resp 18 | Wt 188.2 lb

## 2018-01-27 DIAGNOSIS — J040 Acute laryngitis: Secondary | ICD-10-CM

## 2018-01-27 DIAGNOSIS — R6889 Other general symptoms and signs: Secondary | ICD-10-CM

## 2018-01-27 DIAGNOSIS — R05 Cough: Secondary | ICD-10-CM

## 2018-01-27 DIAGNOSIS — R059 Cough, unspecified: Secondary | ICD-10-CM

## 2018-01-27 LAB — POCT INFLUENZA A/B
Influenza A, POC: NEGATIVE
Influenza B, POC: NEGATIVE

## 2018-01-27 MED ORDER — BENZONATATE 200 MG PO CAPS: 200.0000 mg | ORAL_CAPSULE | Freq: Two times a day (BID) | ORAL | 0 refills | Status: DC | PRN

## 2018-01-27 NOTE — Progress Notes (Signed)
  Subjective:     Patient ID: Mandy Riggs, female   DOB: December 13, 1970, 47 y.o.   MRN: [id number]  HPI   Mandy Riggs is a pleasant 47 y.o. female here with a 3 day history of sore throat, cough, and laryngitis. Says she has been coughing hard for 2-3 days. This morning she woke up and she didn't have a voice. She has a productive cough. No fever or chills. Has been taking OTC tylenol and mucinex which helps some. The cough it keeping her awake at night.  Her daughter tested negative for the flu however they treated her anyway because she had a fever.   Review of Systems  Constitutional: Positive for fatigue. Negative for fever.  HENT: Positive for congestion and sore throat.   Respiratory: Positive for cough. Negative for shortness of breath and wheezing.     Objective:   Physical Exam  Constitutional: She is oriented to person, place, and time. She appears well-developed and well-nourished.  Non-toxic appearance. She appears ill. No distress.  HENT:  Head: Normocephalic.  Mouth/Throat: Uvula is midline and mucous membranes are normal. No oral lesions. No uvula swelling. Posterior oropharyngeal erythema present. No oropharyngeal exudate, posterior oropharyngeal edema or tonsillar abscesses. Tonsils are 0 on the right. Tonsils are 0 on the left.  Eyes: Pupils are equal, round, and reactive to light.  Neck: Normal range of motion.  Neurological: She is alert and oriented to person, place, and time.  Psychiatric: Her behavior is normal.   Assessment:   1. Flu-like symptoms - POCT Influenza A/B  2. Laryngitis, acute  3. Cough  Other orders - benzonatate (TESSALON) 200 MG capsule; Take 1 capsule (200 mg total) by mouth 2 (two) times daily as needed for cough.  Plan:    Discharge home in stable condition Influenza PCR negative Increase oral fluid intake Ok to continue mucinex OTC Benadryl or zyrtec at night OTC as directed on the bottle Rest Return in 3-5 days if  symptoms worsen    Mandy Riggs, Artist Pais, NP 01/27/2018 7:25 PM

## 2018-01-30 ENCOUNTER — Ambulatory Visit (INDEPENDENT_AMBULATORY_CARE_PROVIDER_SITE_OTHER): Payer: Self-pay | Admitting: Family Medicine

## 2018-01-30 VITALS — BP 130/90 | HR 72 | Temp 98.9°F | Resp 20 | Wt 187.6 lb

## 2018-01-30 DIAGNOSIS — J019 Acute sinusitis, unspecified: Secondary | ICD-10-CM

## 2018-01-30 DIAGNOSIS — H1031 Unspecified acute conjunctivitis, right eye: Secondary | ICD-10-CM

## 2018-01-30 MED ORDER — PSEUDOEPH-BROMPHEN-DM 30-2-10 MG/5ML PO SYRP: 10.0000 mL | ORAL_SOLUTION | Freq: Three times a day (TID) | ORAL | 0 refills | Status: DC | PRN

## 2018-01-30 MED ORDER — AMOXICILLIN-POT CLAVULANATE 875-125 MG PO TABS: 1.0000 | ORAL_TABLET | Freq: Two times a day (BID) | ORAL | 0 refills | Status: AC

## 2018-01-30 MED ORDER — POLYMYXIN B-TRIMETHOPRIM 10000-0.1 UNIT/ML-% OP SOLN: 1.0000 [drp] | Freq: Four times a day (QID) | OPHTHALMIC | 0 refills | Status: DC

## 2018-01-30 MED FILL — BROMPHENIR-PSEUDOEPHED-DM S: 30-2-10 | 4 days supply | Qty: 120 | Fill #0

## 2018-01-30 MED FILL — AMOX-CLAV 875-125 MG TABLET: 875-125 | 7 days supply | Qty: 14 | Fill #0

## 2018-01-30 MED FILL — POLYMYXIN B/TMP EYE DROPS: 10000-0.1 | 50 days supply | Qty: 10 | Fill #0

## 2018-01-30 NOTE — Patient Instructions (Addendum)
Bacterial Conjunctivitis Bacterial conjunctivitis is an infection of your conjunctiva. This is the clear membrane that covers the white part of your eye and the inner surface of your eyelid. This condition can make your eye:  Red or pink.  Itchy.  This condition is caused by bacteria. This condition spreads very easily from person to person (is contagious) and from one eye to the other eye. Follow these instructions at home: Medicines  Take or apply your antibiotic medicine as told by your doctor. Do not stop taking or applying the antibiotic even if you start to feel better.  Take or apply over-the-counter and prescription medicines only as told by your doctor.  Do not touch your eyelid with the eye drop bottle or the ointment tube. Managing discomfort  Wipe any fluid from your eye with a warm, wet washcloth or a cotton ball.  Place a cool, clean washcloth on your eye. Do this for 10-20 minutes, 3-4 times per day. General instructions  Do not wear contact lenses until the irritation is gone. Wear glasses until your doctor says it is okay to wear contacts.  Do not wear eye makeup until your symptoms are gone. Throw away any old makeup.  Change or wash your pillowcase every day.  Do not share towels or washcloths with anyone.  Wash your hands often with soap and water. Use paper towels to dry your hands.  Do not touch or rub your eyes.  Do not drive or use heavy machinery if your vision is blurry. Contact a doctor if:  You have a fever.  Your symptoms do not get better after 10 days. Get help right away if:  You have a fever and your symptoms suddenly get worse.  You have very bad pain when you move your eye.  Your face: ? Hurts. ? Is red. ? Is swollen.  You have sudden loss of vision. This information is not intended to replace advice given to you by your health care provider. Make sure you discuss any questions you have with your health care provider. Document  Released: 01/20/2008 Document Revised: 09/18/2015 Document Reviewed: 01/23/2015 Elsevier Interactive Patient Education  2018 [employer]. Sinusitis, Adult Sinusitis is soreness and inflammation of your sinuses. Sinuses are hollow spaces in the bones around your face. They are located:  Around your eyes.  In the middle of your forehead.  Behind your nose.  In your cheekbones.  Your sinuses and nasal passages are lined with a stringy fluid (mucus). Mucus normally drains out of your sinuses. When your nasal tissues get inflamed or swollen, the mucus can get trapped or blocked so air cannot flow through your sinuses. This lets bacteria, viruses, and funguses grow, and that leads to infection. Follow these instructions at home: Medicines  Take, use, or apply over-the-counter and prescription medicines only as told by your doctor. These may include nasal sprays.  If you were prescribed an antibiotic medicine, take it as told by your doctor. Do not stop taking the antibiotic even if you start to feel better. Hydrate and Humidify  Drink enough water to keep your pee (urine) clear or pale yellow.  Use a cool mist humidifier to keep the humidity level in your home above 50%.  Breathe in steam for 10-15 minutes, 3-4 times a day or as told by your doctor. You can do this in the bathroom while a hot shower is running.  Try not to spend time in cool or dry air. Rest  Rest as much  as possible.  Sleep with your head raised (elevated).  Make sure to get enough sleep each night. General instructions  Put a warm, moist washcloth on your face 3-4 times a day or as told by your doctor. This will help with discomfort.  Wash your hands often with soap and water. If there is no soap and water, use hand sanitizer.  Do not smoke. Avoid being around people who are smoking (secondhand smoke).  Keep all follow-up visits as told by your doctor. This is important. Contact a doctor if:  You have a  fever.  Your symptoms get worse.  Your symptoms do not get better within 10 days. Get help right away if:  You have a very bad headache.  You cannot stop throwing up (vomiting).  You have pain or swelling around your face or eyes.  You have trouble seeing.  You feel confused.  Your neck is stiff.  You have trouble breathing. This information is not intended to replace advice given to you by your health care provider. Make sure you discuss any questions you have with your health care provider. Document Released: 09/29/2007 Document Revised: 12/07/2015 Document Reviewed: 02/05/2015 Elsevier Interactive Patient Education  [employer].

## 2018-01-30 NOTE — Progress Notes (Signed)
Mandy Riggs is a 47 y.o. female who presents today with concerns of 6 days of symptoms and known contacts with infected persons (granddaughter) who had pink eye about 2-3 weeks ago.  Review of Systems  Constitutional: Negative for chills, fever and malaise/fatigue.  HENT: Positive for congestion. Negative for ear discharge, ear pain, sinus pain and sore throat.   Eyes: Positive for discharge and redness. Negative for blurred vision, double vision and photophobia.  Respiratory: Positive for cough. Negative for sputum production and shortness of breath.   Cardiovascular: Negative.  Negative for chest pain.  Gastrointestinal: Negative for abdominal pain, diarrhea, nausea and vomiting.  Genitourinary: Negative for dysuria, frequency, hematuria and urgency.  Musculoskeletal: Negative for myalgias.  Skin: Negative.   Neurological: Negative for headaches.  Endo/Heme/Allergies: Negative.   Psychiatric/Behavioral: Negative.     O: Vitals:   01/30/18 0944  BP: 130/90  Pulse: 72  Resp: 20  Temp: 98.9 F (37.2 C)  SpO2: 99%     Physical Exam  Constitutional: She is oriented to person, place, and time. Vital signs are normal. She appears well-developed and well-nourished. She is active.  Non-toxic appearance. She does not have a sickly appearance.  HENT:  Head: Normocephalic.  Right Ear: Hearing, tympanic membrane, external ear and ear canal normal.  Left Ear: Hearing, tympanic membrane, external ear and ear canal normal.  Nose: Nose normal.  Mouth/Throat: Uvula is midline and oropharynx is clear and moist.  Eyes: Right conjunctiva is injected. Right conjunctiva has a hemorrhage.    Neck: Normal range of motion. Neck supple.  Cardiovascular: Normal rate, regular rhythm, normal heart sounds and normal pulses.  Pulmonary/Chest: Effort normal. She has rhonchi in the right lower field and the left lower field.  Abdominal: Soft. Bowel sounds are normal.  Musculoskeletal: Normal range  of motion.  Lymphadenopathy:       Head (right side): No submental and no submandibular adenopathy present.       Head (left side): No submental and no submandibular adenopathy present.    She has no cervical adenopathy.  Neurological: She is alert and oriented to person, place, and time.  Psychiatric: She has a normal mood and affect.  Vitals reviewed.  A: 1. Acute non-recurrent sinusitis, unspecified location   2. Acute conjunctivitis of right eye, unspecified acute conjunctivitis type    P: Discussed exam findings, diagnosis etiology and medication use and indications reviewed with patient. Follow- Up and discharge instructions provided. No emergent/urgent issues found on exam.  Patient verbalized understanding of information provided and agrees with plan of care (POC), all questions answered.  1. Acute non-recurrent sinusitis, unspecified location - brompheniramine-pseudoephedrine-DM 30-2-10 MG/5ML syrup; Take 10 mLs by mouth 3 (three) times daily as needed. - amoxicillin-clavulanate (AUGMENTIN) 875-125 MG tablet; Take 1 tablet by mouth 2 (two) times daily for 7 days.  2. Acute conjunctivitis of right eye, unspecified acute conjunctivitis type - trimethoprim-polymyxin b (POLYTRIM) ophthalmic solution; Place 1 drop into the right eye every 6 (six) hours.

## 2018-02-01 ENCOUNTER — Telehealth: Payer: Self-pay

## 2018-02-01 NOTE — Telephone Encounter (Signed)
I left a message to the patient asking to call us back. 

## 2018-04-03 ENCOUNTER — Other Ambulatory Visit (HOSPITAL_COMMUNITY): Payer: Self-pay | Admitting: Specialist

## 2018-04-03 DIAGNOSIS — M7062 Trochanteric bursitis, left hip: Secondary | ICD-10-CM | POA: Diagnosis not present

## 2018-04-03 DIAGNOSIS — M545 Low back pain, unspecified: Secondary | ICD-10-CM

## 2018-04-03 DIAGNOSIS — M5416 Radiculopathy, lumbar region: Secondary | ICD-10-CM | POA: Diagnosis not present

## 2018-04-03 DIAGNOSIS — M419 Scoliosis, unspecified: Secondary | ICD-10-CM | POA: Diagnosis not present

## 2018-04-03 MED FILL — predniSONE 5 MG TABS: 5 | 6 days supply | Qty: 21 | Fill #0

## 2018-04-03 MED FILL — GABAPENTIN 300 MG CAPSULE: 300 | 10 days supply | Qty: 30 | Fill #0

## 2018-04-07 ENCOUNTER — Ambulatory Visit (HOSPITAL_COMMUNITY)
Admission: RE | Admit: 2018-04-07 | Discharge: 2018-04-07 | Disposition: A | Discharge status: Home / Self Care | Payer: 59 | Source: Ambulatory Visit | Attending: Specialist | Admitting: Specialist

## 2018-04-07 DIAGNOSIS — M545 Low back pain, unspecified: Secondary | ICD-10-CM

## 2018-04-07 DIAGNOSIS — D259 Leiomyoma of uterus, unspecified: Secondary | ICD-10-CM | POA: Diagnosis not present

## 2018-04-07 DIAGNOSIS — M5127 Other intervertebral disc displacement, lumbosacral region: Secondary | ICD-10-CM | POA: Diagnosis not present

## 2018-04-17 DIAGNOSIS — M5416 Radiculopathy, lumbar region: Secondary | ICD-10-CM | POA: Diagnosis not present

## 2018-04-17 DIAGNOSIS — G5601 Carpal tunnel syndrome, right upper limb: Secondary | ICD-10-CM | POA: Diagnosis not present

## 2018-04-17 DIAGNOSIS — M7062 Trochanteric bursitis, left hip: Secondary | ICD-10-CM | POA: Diagnosis not present

## 2018-04-17 DIAGNOSIS — M419 Scoliosis, unspecified: Secondary | ICD-10-CM | POA: Diagnosis not present

## 2018-05-01 MED FILL — traMADol HCL 50 MG TABS: 50 | 5 days supply | Qty: 20 | Fill #0

## 2018-05-04 MED FILL — OXYCODONE-ACETAMINOPHEN 5-3: 5-325 | 5 days supply | Qty: 15 | Fill #0

## 2018-05-12 ENCOUNTER — Ambulatory Visit (INDEPENDENT_AMBULATORY_CARE_PROVIDER_SITE_OTHER): Payer: Self-pay | Admitting: Nurse Practitioner

## 2018-05-12 VITALS — BP 160/78 | HR 89 | Temp 98.4°F | Resp 20 | Wt 190.2 lb

## 2018-05-12 DIAGNOSIS — B9789 Other viral agents as the cause of diseases classified elsewhere: Secondary | ICD-10-CM

## 2018-05-12 DIAGNOSIS — J101 Influenza due to other identified influenza virus with other respiratory manifestations: Secondary | ICD-10-CM

## 2018-05-12 DIAGNOSIS — R6883 Chills (without fever): Secondary | ICD-10-CM

## 2018-05-12 DIAGNOSIS — J019 Acute sinusitis, unspecified: Secondary | ICD-10-CM

## 2018-05-12 LAB — POCT INFLUENZA A/B
INFLUENZA A, POC: POSITIVE — AB
Influenza B, POC: NEGATIVE

## 2018-05-12 MED ORDER — OSELTAMIVIR PHOSPHATE 75 MG PO CAPS: 75.0000 mg | ORAL_CAPSULE | Freq: Two times a day (BID) | ORAL | 0 refills | Status: AC

## 2018-05-12 MED ORDER — CETIRIZINE HCL 10 MG PO TABS: 10.0000 mg | ORAL_TABLET | Freq: Every day | ORAL | 10 refills | Status: DC

## 2018-05-12 MED ORDER — PROMETHAZINE-DM 6.25-15 MG/5ML PO SYRP: 5.0000 mL | ORAL_SOLUTION | Freq: Four times a day (QID) | ORAL | 0 refills | Status: AC | PRN

## 2018-05-12 MED FILL — OSELTAMIVIR PHOS 75 MG CAP: 75 | 5 days supply | Qty: 10 | Fill #0

## 2018-05-12 MED FILL — PROMETHAZINE W/DM SYRUP: 6.25-15 | 7 days supply | Qty: 140 | Fill #0

## 2018-05-12 NOTE — Progress Notes (Signed)
Subjective:  Mandy Riggs is a 48 y.o. female who presents for evaluation of possible sinusitis.  Symptoms include chills, nasal congestion, post nasal drip, productive cough with yellow colored sputum and sinus pressure.  Onset of symptoms was 2 days ago, and has been gradually worsening since that time.  Treatment to date:  decongestants.  High risk factors for influenza complications:  co-morbid illness. Patient has hypertension, but denies a history of asthma, which is indicated in her chart.  She informs that while she has been taking Mucinex, her blood pressure and heart rate have increased.  Patient states her daughter told her she needed to stop.  Patient stopped the Mucinex on yesterday.  Patient's blood pressure is elevated here in the office.  The following portions of the patient's history were reviewed and updated as appropriate:  allergies, current medications and past medical history.  Constitutional: positive for anorexia, chills and fatigue, negative for fevers, malaise and sweats Eyes: negative Ears, nose, mouth, throat, and face: positive for hoarseness, nasal congestion and post-nasal drip and throat irritation, negative for ear drainage and earaches Respiratory: positive for cough and sputum, negative for asthma, chronic bronchitis, dyspnea on exertion, pneumonia, stridor and wheezing Cardiovascular: negative Gastrointestinal: positive for decreased appetite, negative for abdominal pain, diarrhea, nausea and vomiting Neurological: positive for headaches, negative for coordination problems, dizziness, paresthesia, vertigo and weakness Objective:  BP (!) 160/78 (BP Location: Right Arm, Patient Position: Sitting, Cuff Size: Normal)   Pulse 89   Temp 98.4 F (36.9 C) (Oral)   Resp 20   Wt 190 lb 3.2 oz (86.3 kg)   SpO2 98%   BMI 33.69 kg/m  Physical Exam Vitals signs reviewed.  Constitutional:      Appearance: Normal appearance.     Comments: Appears fatigued  HENT:      Head: Normocephalic.     Right Ear: Tympanic membrane and ear canal normal.     Left Ear: Tympanic membrane and ear canal normal.     Nose: Congestion present.     Comments: +maxillary sinus tenderness bilaterally, no frontal sinus tenderness, turbinates swollen and inflamed, moderate nasal congestion    Mouth/Throat:     Mouth: Mucous membranes are moist.     Pharynx: Posterior oropharyngeal erythema present. No oropharyngeal exudate.     Comments: Moderate oropharyngeal erythema, no oropharyngeal edema, tonsils 0 bilaterally, no exudates Eyes:     Pupils: Pupils are equal, round, and reactive to light.  Neck:     Musculoskeletal: Normal range of motion and neck supple. No neck rigidity or muscular tenderness.  Cardiovascular:     Rate and Rhythm: Normal rate and regular rhythm.     Pulses: Normal pulses.     Heart sounds: Normal heart sounds.  Pulmonary:     Effort: Pulmonary effort is normal. No respiratory distress.     Breath sounds: Normal breath sounds. No wheezing or rales.  Abdominal:     General: Abdomen is flat. There is no distension.     Tenderness: There is no abdominal tenderness.  Lymphadenopathy:     Cervical: No cervical adenopathy.  Skin:    General: Skin is warm and dry.     Capillary Refill: Capillary refill takes less than 2 seconds.  Neurological:     General: No focal deficit present.     Mental Status: She is alert and oriented to person, place, and time.     Cranial Nerves: No cranial nerve deficit.  Psychiatric:  Mood and Affect: Mood normal.        Thought Content: Thought content normal.     Assessment:  Acute Viral Sinusitis Influenza A   Plan:   Exam findings, diagnosis etiology and medication use and indications reviewed with patient. Follow- Up and discharge instructions provided. No emergent/urgent issues found on exam.  Based on the patient's physical exam, symptoms, duration of symptoms, and positive flu test, will prescribe  the patient Tamiflu.  We will also provide symptomatic treatment for the patient to include medicine for cough, and for her sinus symptoms.  Also pick up over-the-counter Coricidin HBP to take for her cough as it will not impact her blood pressure.  With patient that she should have her blood pressure rechecked within the next 48 to 72 hours to make sure it has returned to baseline.  Patient education was provided. Patient verbalized understanding of information provided and agrees with plan of care (POC), all questions answered. The patient is advised to call or return to clinic if condition does not see an improvement in symptoms, or to seek the care of the closest emergency department if condition worsens with the above plan.   1. Acute viral sinusitis  - promethazine-dextromethorphan (PROMETHAZINE-DM) 6.25-15 MG/5ML syrup; Take 5 mLs by mouth 4 (four) times daily as needed for up to 7 days for cough.  Dispense: 140 mL; Refill: 0 - cetirizine (ZYRTEC) 10 MG tablet; Take 1 tablet (10 mg total) by mouth daily for 30 days.  Dispense: 30 tablet; Refill: 10 -Take medication as prescribed. -Purchase over-the-counter Coricidin HBP cough and cold to help with your cough. Stop the Mucinex you are currently taking. -Ibuprofen or Tylenol for pain, fever, or general discomfort. -Increase fluids. -Sleep elevated on at least 2 pillows at bedtime to help with cough. -Use a humidifier or vaporizer when at home and during sleep. -May use a teaspoon of honey or over-the-counter cough drops to help with cough. -May use normal saline nasal spray to help with nasal congestion throughout the day. -Remain home until you are fever free for at least 24 hours. -Work note provided for patient to RTW on 05/15/2018. -Follow-up if symptoms do not improve.  2. Influenza A  - promethazine-dextromethorphan (PROMETHAZINE-DM) 6.25-15 MG/5ML syrup; Take 5 mLs by mouth 4 (four) times daily as needed for up to 7 days for cough.   Dispense: 140 mL; Refill: 0 - oseltamivir (TAMIFLU) 75 MG capsule; Take 1 capsule (75 mg total) by mouth 2 (two) times daily for 5 days.  Dispense: 10 capsule; Refill: 0 -Take medication as prescribed. -Purchase over-the-counter Coricidin HBP cough and cold to help with your cough. Stop the Mucinex you are currently taking. -Ibuprofen or Tylenol for pain, fever, or general discomfort. -Increase fluids. -Sleep elevated on at least 2 pillows at bedtime to help with cough. -Use a humidifier or vaporizer when at home and during sleep. -May use a teaspoon of honey or over-the-counter cough drops to help with cough. -May use normal saline nasal spray to help with nasal congestion throughout the day. -Remain home until you are fever free for at least 24 hours. -Work note provided for patient to RTW on 05/15/2018. -Follow-up if symptoms do not improve.  3. Chills  - POCT Influenza A/B- positive Influenza A

## 2018-05-12 NOTE — Patient Instructions (Addendum)
Sinusitis, Adult -Take medication as prescribed. -Purchase over-the-counter Coricidin HBP cough and cold to help with your cough. Stop the Mucinex you are currently taking. -Ibuprofen or Tylenol for pain, fever, or general discomfort. -Increase fluids. -Sleep elevated on at least 2 pillows at bedtime to help with cough. -Use a humidifier or vaporizer when at home and during sleep. -May use a teaspoon of honey or over-the-counter cough drops to help with cough. -May use normal saline nasal spray to help with nasal congestion throughout the day. -Remain home until you are fever free for at least 24 hours. -Work note provided for patient to RTW on 05/15/2018. -Follow-up if symptoms do not improve.   inusitis is inflammation of your sinuses. Sinuses are hollow spaces in the bones around your face. Your sinuses are located:  Around your eyes.  In the middle of your forehead.  Behind your nose.  In your cheekbones. Mucus normally drains out of your sinuses. When your nasal tissues become inflamed or swollen, mucus can become trapped or blocked. This allows bacteria, viruses, and fungi to grow, which leads to infection. Most infections of the sinuses are caused by a virus. Sinusitis can develop quickly. It can last for up to 4 weeks (acute) or for more than 12 weeks (chronic). Sinusitis often develops after a cold. What are the causes? This condition is caused by anything that creates swelling in the sinuses or stops mucus from draining. This includes:  Allergies.  Asthma.  Infection from bacteria or viruses.  Deformities or blockages in your nose or sinuses.  Abnormal growths in the nose (nasal polyps).  Pollutants, such as chemicals or irritants in the air.  Infection from fungi (rare). What increases the risk? You are more likely to develop this condition if you:  Have a weak body defense system (immune system).  Do a lot of swimming or diving.  Overuse nasal  sprays.  Smoke. What are the signs or symptoms? The main symptoms of this condition are pain and a feeling of pressure around the affected sinuses. Other symptoms include:  Stuffy nose or congestion.  Thick drainage from your nose.  Swelling and warmth over the affected sinuses.  Headache.  Upper toothache.  A cough that may get worse at night.  Extra mucus that collects in the throat or the back of the nose (postnasal drip).  Decreased sense of smell and taste.  Fatigue.  A fever.  Sore throat.  Bad breath. How is this diagnosed? This condition is diagnosed based on:  Your symptoms.  Your medical history.  A physical exam.  Tests to find out if your condition is acute or chronic. This may include: ? Checking your nose for nasal polyps. ? Viewing your sinuses using a device that has a light (endoscope). ? Testing for allergies or bacteria. ? Imaging tests, such as an MRI or CT scan. In rare cases, a bone biopsy may be done to rule out more serious types of fungal sinus disease. How is this treated? Treatment for sinusitis depends on the cause and whether your condition is chronic or acute.  If caused by a virus, your symptoms should go away on their own within 10 days. You may be given medicines to relieve symptoms. They include: ? Medicines that shrink swollen nasal passages (topical intranasal decongestants). ? Medicines that treat allergies (antihistamines). ? A spray that eases inflammation of the nostrils (topical intranasal corticosteroids). ? Rinses that help get rid of thick mucus in your nose (nasal saline washes).  If caused by bacteria, your health care provider may recommend waiting to see if your symptoms improve. Most bacterial infections will get better without antibiotic medicine. You may be given antibiotics if you have: ? A severe infection. ? A weak immune system.  If caused by narrow nasal passages or nasal polyps, you may need to have  surgery. Follow these instructions at home: Medicines  Take, use, or apply over-the-counter and prescription medicines only as told by your health care provider. These may include nasal sprays.  If you were prescribed an antibiotic medicine, take it as told by your health care provider. Do not stop taking the antibiotic even if you start to feel better. Hydrate and humidify   Drink enough fluid to keep your urine pale yellow. Staying hydrated will help to thin your mucus.  Use a cool mist humidifier to keep the humidity level in your home above 50%.  Inhale steam for 10-15 minutes, 3-4 times a day, or as told by your health care provider. You can do this in the bathroom while a hot shower is running.  Limit your exposure to cool or dry air. Rest  Rest as much as possible.  Sleep with your head raised (elevated).  Make sure you get enough sleep each night. General instructions   Apply a warm, moist washcloth to your face 3-4 times a day or as told by your health care provider. This will help with discomfort.  Wash your hands often with soap and water to reduce your exposure to germs. If soap and water are not available, use hand sanitizer.  Do not smoke. Avoid being around people who are smoking (secondhand smoke).  Keep all follow-up visits as told by your health care provider. This is important. Contact a health care provider if:  You have a fever.  Your symptoms get worse.  Your symptoms do not improve within 10 days. Get help right away if:  You have a severe headache.  You have persistent vomiting.  You have severe pain or swelling around your face or eyes.  You have vision problems.  You develop confusion.  Your neck is stiff.  You have trouble breathing. Summary  Sinusitis is soreness and inflammation of your sinuses. Sinuses are hollow spaces in the bones around your face.  This condition is caused by nasal tissues that become inflamed or swollen.  The swelling traps or blocks the flow of mucus. This allows bacteria, viruses, and fungi to grow, which leads to infection.  If you were prescribed an antibiotic medicine, take it as told by your health care provider. Do not stop taking the antibiotic even if you start to feel better.  Keep all follow-up visits as told by your health care provider. This is important. This information is not intended to replace advice given to you by your health care provider. Make sure you discuss any questions you have with your health care provider. Document Released: 04/12/2005 Document Revised: 09/12/2017 Document Reviewed: 09/12/2017 Elsevier Interactive Patient Education  2019 [place].  Influenza, Adult Influenza, more commonly known as "the flu," is a viral infection that mainly affects the respiratory tract. The respiratory tract includes organs that help you breathe, such as the lungs, nose, and throat. The flu causes many symptoms similar to the common cold along with high fever and body aches. The flu spreads easily from person to person (is contagious). Getting a flu shot (influenza vaccination) every year is the best way to prevent the flu. What are  the causes? This condition is caused by the influenza virus. You can get the virus by:  Breathing in droplets that are in the air from an infected person's cough or sneeze.  Touching something that has been exposed to the virus (has been contaminated) and then touching your mouth, nose, or eyes. What increases the risk? The following factors may make you more likely to get the flu:  Not washing or sanitizing your hands often.  Having close contact with many people during cold and flu season.  Touching your mouth, eyes, or nose without first washing or sanitizing your hands.  Not getting a yearly (annual) flu shot. You may have a higher risk for the flu, including serious problems such as a lung infection (pneumonia), if you:  Are older  than 65.  Are pregnant.  Have a weakened disease-fighting system (immune system). You may have a weakened immune system if you: ? Have HIV or AIDS. ? Are undergoing chemotherapy. ? Are taking medicines that reduce (suppress) the activity of your immune system.  Have a long-term (chronic) illness, such as heart disease, kidney disease, diabetes, or lung disease.  Have a liver disorder.  Are severely overweight (morbidly obese).  Have anemia. This is a condition that affects your red blood cells.  Have asthma. What are the signs or symptoms? Symptoms of this condition usually begin suddenly and last 4-14 days. They may include:  Fever and chills.  Headaches, body aches, or muscle aches.  Sore throat.  Cough.  Runny or stuffy (congested) nose.  Chest discomfort.  Poor appetite.  Weakness or fatigue.  Dizziness.  Nausea or vomiting. How is this diagnosed? This condition may be diagnosed based on:  Your symptoms and medical history.  A physical exam.  Swabbing your nose or throat and testing the fluid for the influenza virus. How is this treated? If the flu is diagnosed early, you can be treated with medicine that can help reduce how severe the illness is and how long it lasts (antiviral medicine). This may be given by mouth (orally) or through an IV. Taking care of yourself at home can help relieve symptoms. Your health care provider may recommend:  Taking over-the-counter medicines.  Drinking plenty of fluids. In many cases, the flu goes away on its own. If you have severe symptoms or complications, you may be treated in a hospital. Follow these instructions at home: Activity  Rest as needed and get plenty of sleep.  Stay home from work or school as told by your health care provider. Unless you are visiting your health care provider, avoid leaving home until your fever has been gone for 24 hours without taking medicine. Eating and drinking  Take an oral  rehydration solution (ORS). This is a drink that is sold at pharmacies and retail stores.  Drink enough fluid to keep your urine pale yellow.  Drink clear fluids in small amounts as you are able. Clear fluids include water, ice chips, diluted fruit juice, and low-calorie sports drinks.  Eat bland, easy-to-digest foods in small amounts as you are able. These foods include bananas, applesauce, rice, lean meats, toast, and crackers.  Avoid drinking fluids that contain a lot of sugar or caffeine, such as energy drinks, regular sports drinks, and soda.  Avoid alcohol.  Avoid spicy or fatty foods. General instructions      Take over-the-counter and prescription medicines only as told by your health care provider.  Use a cool mist humidifier to add humidity to the  air in your home. This can make it easier to breathe.  Cover your mouth and nose when you cough or sneeze.  Wash your hands with soap and water often, especially after you cough or sneeze. If soap and water are not available, use alcohol-based hand sanitizer.  Keep all follow-up visits as told by your health care provider. This is important. How is this prevented?   Get an annual flu shot. You may get the flu shot in late summer, fall, or winter. Ask your health care provider when you should get your flu shot.  Avoid contact with people who are sick during cold and flu season. This is generally fall and winter. Contact a health care provider if:  You develop new symptoms.  You have: ? Chest pain. ? Diarrhea. ? A fever.  Your cough gets worse.  You produce more mucus.  You feel nauseous or you vomit. Get help right away if:  You develop shortness of breath or difficulty breathing.  Your skin or nails turn a bluish color.  You have severe pain or stiffness in your neck.  You develop a sudden headache or sudden pain in your face or ear.  You cannot eat or drink without vomiting. Summary  Influenza, more  commonly known as "the flu," is a viral infection that primarily affects your respiratory tract.  Symptoms of the flu usually begin suddenly and last 4-14 days.  Getting an annual flu shot is the best way to prevent getting the flu.  Stay home from work or school as told by your health care provider. Unless you are visiting your health care provider, avoid leaving home until your fever has been gone for 24 hours without taking medicine.  Keep all follow-up visits as told by your health care provider. This is important. This information is not intended to replace advice given to you by your health care provider. Make sure you discuss any questions you have with your health care provider. Document Released: 04/09/2000 Document Revised: 09/28/2017 Document Reviewed: 09/28/2017 Elsevier Interactive Patient Education  2019 [employer].

## 2018-06-21 DIAGNOSIS — M7062 Trochanteric bursitis, left hip: Secondary | ICD-10-CM | POA: Diagnosis not present

## 2018-06-21 DIAGNOSIS — M545 Low back pain: Secondary | ICD-10-CM | POA: Diagnosis not present

## 2018-06-21 DIAGNOSIS — M519 Unspecified thoracic, thoracolumbar and lumbosacral intervertebral disc disorder: Secondary | ICD-10-CM | POA: Diagnosis not present

## 2018-06-21 DIAGNOSIS — M5416 Radiculopathy, lumbar region: Secondary | ICD-10-CM | POA: Diagnosis not present

## 2018-06-21 DIAGNOSIS — G5601 Carpal tunnel syndrome, right upper limb: Secondary | ICD-10-CM | POA: Diagnosis not present

## 2018-06-21 DIAGNOSIS — M419 Scoliosis, unspecified: Secondary | ICD-10-CM | POA: Diagnosis not present

## 2018-06-21 MED FILL — METHOCARBAMOL 500 MG TABS: 500 | 10 days supply | Qty: 30 | Fill #0

## 2018-06-21 MED FILL — predniSONE 5 MG TABS: 5 | 6 days supply | Qty: 21 | Fill #0

## 2018-06-28 ENCOUNTER — Other Ambulatory Visit: Payer: Self-pay | Admitting: Family Medicine

## 2018-06-28 DIAGNOSIS — Z1231 Encounter for screening mammogram for malignant neoplasm of breast: Secondary | ICD-10-CM

## 2018-07-01 DIAGNOSIS — M5416 Radiculopathy, lumbar region: Secondary | ICD-10-CM | POA: Diagnosis not present

## 2018-07-01 DIAGNOSIS — M519 Unspecified thoracic, thoracolumbar and lumbosacral intervertebral disc disorder: Secondary | ICD-10-CM | POA: Diagnosis not present

## 2018-07-19 ENCOUNTER — Ambulatory Visit: Payer: 59 | Admitting: Physical Therapy

## 2018-07-24 ENCOUNTER — Inpatient Hospital Stay: Admission: RE | Admit: 2018-07-24 | Payer: 59 | Source: Ambulatory Visit

## 2018-08-11 MED FILL — predniSONE 5 MG (21) TBPK: 5 | 6 days supply | Qty: 21 | Fill #0

## 2018-09-11 DIAGNOSIS — R600 Localized edema: Secondary | ICD-10-CM | POA: Diagnosis not present

## 2018-09-11 DIAGNOSIS — I10 Essential (primary) hypertension: Secondary | ICD-10-CM | POA: Diagnosis not present

## 2018-09-15 DIAGNOSIS — I10 Essential (primary) hypertension: Secondary | ICD-10-CM | POA: Diagnosis not present

## 2018-09-15 DIAGNOSIS — R609 Edema, unspecified: Secondary | ICD-10-CM | POA: Diagnosis not present

## 2018-09-15 DIAGNOSIS — M5416 Radiculopathy, lumbar region: Secondary | ICD-10-CM | POA: Diagnosis not present

## 2018-09-15 MED FILL — AMLODIPINE BESYLATE 5 MG TA: 5 | 90 days supply | Qty: 90 | Fill #0

## 2018-09-15 MED FILL — LOSARTAN POTASSIUM 100 MG T: 100 | 90 days supply | Qty: 90 | Fill #0

## 2018-09-15 MED FILL — predniSONE 5 MG (21) TBPK: 5 | 6 days supply | Qty: 21 | Fill #1

## 2018-09-19 ENCOUNTER — Ambulatory Visit: Payer: 59

## 2018-09-29 DIAGNOSIS — M5126 Other intervertebral disc displacement, lumbar region: Secondary | ICD-10-CM | POA: Diagnosis not present

## 2018-09-29 DIAGNOSIS — M5416 Radiculopathy, lumbar region: Secondary | ICD-10-CM | POA: Diagnosis not present

## 2018-09-29 MED FILL — CELECOXIB 200 MG CAP: 200 | 30 days supply | Qty: 30 | Fill #0

## 2018-10-11 ENCOUNTER — Encounter: Payer: Self-pay | Admitting: Physical Therapy

## 2018-10-11 ENCOUNTER — Other Ambulatory Visit: Payer: Self-pay

## 2018-10-11 ENCOUNTER — Ambulatory Visit
Admission: RE | Admit: 2018-10-11 | Discharge: 2018-10-11 | Disposition: A | Discharge status: Home / Self Care | Payer: 59 | Source: Ambulatory Visit | Attending: Family Medicine | Admitting: Family Medicine

## 2018-10-11 ENCOUNTER — Ambulatory Visit: Payer: 59 | Attending: Neurological Surgery | Admitting: Physical Therapy

## 2018-10-11 DIAGNOSIS — M79605 Pain in left leg: Secondary | ICD-10-CM | POA: Insufficient documentation

## 2018-10-11 DIAGNOSIS — R208 Other disturbances of skin sensation: Secondary | ICD-10-CM | POA: Insufficient documentation

## 2018-10-11 DIAGNOSIS — M5417 Radiculopathy, lumbosacral region: Secondary | ICD-10-CM | POA: Diagnosis not present

## 2018-10-11 DIAGNOSIS — Z1231 Encounter for screening mammogram for malignant neoplasm of breast: Secondary | ICD-10-CM | POA: Diagnosis not present

## 2018-10-11 NOTE — Therapy (Signed)
[place], [place], [address] Phone: [phone]   Fax:  [phone]  Physical Therapy Treatment  Patient Details  Name: Mandy Riggs MRN: [id number] Date of Birth: 08-30-1970 Referring Provider (PT): Dr. Kristeen Miss   Encounter Date: 10/11/2018  PT End of Session - 10/12/18 0622    Visit Number  1    Number of Visits  12    Date for PT Re-Evaluation  11/23/18    Authorization Type  [facility]    PT Start Time  1525    PT Stop Time  1615    PT Time Calculation (min)  50 min    Activity Tolerance  Patient tolerated treatment well;Patient limited by pain    Behavior During Therapy  [facility] for tasks assessed/performed       Past Medical History:  Diagnosis Date  . Acid reflux    occasionally  . Anemia   . Anxiety   . Diverticulitis   . Hypertension    states under control with meds., has been on med. x "a while", per pt.  . Mass of left axilla 06/2015  . Peptic ulcer disease   . Sickle cell trait ([place])   . Wears partial dentures    upper    Past Surgical History:  Procedure Laterality Date  . ANAL FISTULECTOMY    . BREAST BIOPSY Right    benign core  . MASS EXCISION Left 07/21/2015   Procedure: EXCISION MASS LEFT AXILLA;  Surgeon: Excell Seltzer, MD;  Location: [place];  Service: General;  Laterality: Left;  . TONSILLECTOMY      There were no vitals filed for this visit.  Subjective Assessment - 10/11/18 1531    Subjective  Patient injured her back about a year ago.  She had sudden onset of L hip pain, MD thought it was bursitis.  In April, pain changed and intensified.  She had injection back in April and it helped for 2 days. She has L sided numbness and tinging.  She says her L leg is weak, has no back pain at all.  As a result she has difficulty getting up from the bed, chair, walking.  Standing is preffered.  She has modified her work to allow her to avoid heavy lifting.     Limitations  Sitting;Lifting;House hold activities;Other (comment)    How long can you sit comfortably?  not at all in a soft chair    How long can you stand comfortably?  cna be hours    How long can you walk comfortably?  hours once she gets going    Diagnostic tests  MRI in 2019 showed    Patient Stated Goals  patient wants to have less pain and be able to play with granddaughter    Currently in Pain?  Yes    Pain Score  8     Pain Location  Leg    Pain Orientation  Left;Posterior;Lateral    Pain Descriptors / Indicators  Tingling;Radiating;Sharp    Pain Type  Chronic pain    Pain Radiating Towards  LLE to toes    Pain Onset  More than a month ago    Pain Frequency  Intermittent    Aggravating Factors   sitting    Pain Relieving Factors  laying down, sleeping in recliner    Effect of Pain on Daily Activities  never comfortable, lives with pain    Multiple Pain Sites  No  [facility] PT Assessment - 10/12/18 0001      Assessment   Medical Diagnosis  herniated disc     Referring Provider (PT)  Dr. Kristeen Miss    Onset Date/Surgical Date  --   acute on chronic (4/20)   Next MD Visit  6/23 for injection    Prior Therapy  No       Precautions   Precautions  None      Restrictions   Weight Bearing Restrictions  No    Other Position/Activity Restrictions  not officially , did change her duties   Dr. Tonita Cong did say no lifting or twisting etc      Balance Screen   Has the patient fallen in the past 6 months  No      [place] residence    Living Arrangements  Spouse/significant other;Children    Type of [place] to enter    Entrance Stairs-Number of Steps  2    Entrance Stairs-Rails  None    Home Layout  One level      Prior Function   Level of Independence  Independent with basic ADLs;Independent with household mobility without device;Independent with community mobility without device    Vocation   Full time employment    Tax inspector and CNA work     Leisure  family, grandkids, fishing       Cognition   Overall Cognitive Status  Within Functional Limits for tasks assessed      Observation/Other Assessments   Focus on Therapeutic Outcomes (FOTO)   31%      Sensation   Light Touch  Impaired by gross assessment    Additional Comments  decreased LLE       Coordination   Gross Motor Movements are Fluid and Coordinated  Not tested      Posture/Postural Control   Posture Comments  leans Rt to unweight L side, leans forward in sitting with flat back       AROM   Lumbar Flexion  30 deg    Lumbar Extension  25 deg    Lumbar - Right Side Bend  lacks 25% no pain     Lumbar - Left Side Bend  lacks25%no pain     Lumbar - Right Rotation  WFL    Lumbar - Left Rotation  pain to L       Strength   Overall Strength Comments  WFL in knees    Right Hip Flexion  4/5    Right Hip ABduction  4/5    Left Hip Flexion  4/5    Left Hip ABduction  4/5      Flexibility   Hamstrings  tight L > R       Palpation   Palpation comment  min tenderness in sciatic notch,  L lateral hip, glutes       Special Tests   Other special tests  neg SLR       Transfers   Comments  mod I with all transfers         PT Education - 10/12/18 0621    Education Details  PT, anatomyof lumbar spine, extension to reliev enerve pain, back pain vs nerve pain, POC , HEP    Person(s) Educated  Patient    Methods  Explanation;Demonstration;Handout    Comprehension  Verbalized understanding;Returned demonstration  PT Long Term Goals - 10/12/18 7353      PT LONG TERM GOAL #1   Title  Pt will be with HEP for leg pain, flexibility and core    Time  6    Period  Weeks    Status  New    Target Date  11/23/18      PT LONG TERM GOAL #2   Title  FOTO score will improve to less than 25% limited to show functional improvement    Time  6    Period  Weeks    Status  New    Target Date   11/23/18      PT LONG TERM GOAL #3   Title  Pt will be able to perform usual work, houseowrk without difficulty most of the time.    Time  6    Period  Weeks    Status  New    Target Date  11/23/18      PT LONG TERM GOAL #4   Title  Pt will be able to safely lift up to 20 lbs to prepare for more work duties.    Time  6    Period  Weeks    Status  New    Target Date  11/23/18      PT LONG TERM GOAL #5   Title  Pt will be able to report min occasional pain/symptoms in LLE    Time  6    Period  Weeks    Status  New    Target Date  11/23/18            Plan - 10/12/18 2992    Clinical Impression Statement  Patient presents with mod complexity eval of LLE pain due to lumbosacral disc herniation.  6 mos ago she had MRI which showed a 6 mm central disc protrusion.  She has not had back symptoms at all.  She now has LE sensory changes and more intense pain.  She has min weakness in hips but no LE weakness or falls.  I feel she can get some pain relief but she will need to be diligent with her HEP and principles of body mechanics due to the severity of her disc protrusion.  She may ultimately require surgery but as a last resort.    Personal Factors and Comorbidities  Behavior Pattern;Comorbidity 1;Past/Current Experience;Profession;Time since onset of injury/illness/exacerbation    Examination-Activity Limitations  Sit;Squat;Lift;Carry;Caring for Others;Bed Mobility    Examination-Participation Restrictions  Cleaning;Community Activity    Stability/Clinical Decision Making  Evolving/Moderate complexity    Clinical Decision Making  Moderate    Rehab Potential  Good    PT Frequency  2x / week    PT Duration  6 weeks    PT Treatment/Interventions  ADLs/Self Care Home Management;Passive range of motion;Dry needling;Manual techniques;Functional mobility training;Moist Heat;Traction;Therapeutic exercise;Balance training;Electrical Stimulation;Cryotherapy;Patient/family  education;Neuromuscular re-education;Therapeutic activities    PT Next Visit Plan  HEP, extension based stabilization, try quadruped , modalities if needed    PT Home Exercise Plan  POE, standing ext, hamstring and piriformis stretch    Consulted and Agree with Plan of Care  Patient       Patient will benefit from skilled therapeutic intervention in order to improve the following deficits and impairments:  Impaired flexibility, Decreased activity tolerance, Decreased range of motion, Decreased strength, Decreased balance, Postural dysfunction, Increased fascial restricitons, Pain, Improper body mechanics, Impaired sensation, Decreased mobility  Visit Diagnosis: 1. Radiculopathy, lumbosacral region  2. Pain in left leg   3. Other disturbances of skin sensation        Problem List Patient Active Problem List   Diagnosis Date Noted  . HYPERTENSION 12/19/2008  . ASTHMA 12/19/2008  . HEADACHE, CHRONIC 12/19/2008  . COUGH 12/19/2008    Heliodoro Domagalski 10/12/2018, 9:32 AM  [facility] [address] [place], [place], [address] Phone: [phone]   Fax:  [phone]  Name: Mandy Riggs MRN: [id number] Date of Birth: 02-May-1970  Raeford Razor, PT 10/12/18 9:33 AM Phone: [phone] Fax: [phone]

## 2018-11-02 DIAGNOSIS — M5116 Intervertebral disc disorders with radiculopathy, lumbar region: Secondary | ICD-10-CM | POA: Diagnosis not present

## 2018-11-02 DIAGNOSIS — M5416 Radiculopathy, lumbar region: Secondary | ICD-10-CM | POA: Diagnosis not present

## 2018-11-06 DIAGNOSIS — Z862 Personal history of diseases of the blood and blood-forming organs and certain disorders involving the immune mechanism: Secondary | ICD-10-CM | POA: Diagnosis not present

## 2018-11-06 DIAGNOSIS — Z0000 Encounter for general adult medical examination without abnormal findings: Secondary | ICD-10-CM | POA: Diagnosis not present

## 2018-11-06 DIAGNOSIS — I10 Essential (primary) hypertension: Secondary | ICD-10-CM | POA: Diagnosis not present

## 2018-11-06 DIAGNOSIS — F419 Anxiety disorder, unspecified: Secondary | ICD-10-CM | POA: Diagnosis not present

## 2018-11-06 DIAGNOSIS — K219 Gastro-esophageal reflux disease without esophagitis: Secondary | ICD-10-CM | POA: Diagnosis not present

## 2018-11-06 DIAGNOSIS — J302 Other seasonal allergic rhinitis: Secondary | ICD-10-CM | POA: Diagnosis not present

## 2018-11-06 DIAGNOSIS — R609 Edema, unspecified: Secondary | ICD-10-CM | POA: Diagnosis not present

## 2018-11-06 DIAGNOSIS — Z8719 Personal history of other diseases of the digestive system: Secondary | ICD-10-CM | POA: Diagnosis not present

## 2018-11-06 DIAGNOSIS — E559 Vitamin D deficiency, unspecified: Secondary | ICD-10-CM | POA: Diagnosis not present

## 2018-11-06 MED FILL — FLUTICASONE PROP 50 MCG SPR: 50 | 30 days supply | Qty: 16 | Fill #0

## 2018-11-06 MED FILL — LEVOCETIRIZINE 5 MG TABLET: 5 | 30 days supply | Qty: 30 | Fill #0

## 2018-11-17 DIAGNOSIS — E559 Vitamin D deficiency, unspecified: Secondary | ICD-10-CM | POA: Diagnosis not present

## 2018-11-17 DIAGNOSIS — I10 Essential (primary) hypertension: Secondary | ICD-10-CM | POA: Diagnosis not present

## 2018-11-30 MED FILL — METHOCARBAMOL 500 MG TABS: 500 | 13 days supply | Qty: 40 | Fill #0

## 2019-01-26 DIAGNOSIS — M5126 Other intervertebral disc displacement, lumbar region: Secondary | ICD-10-CM | POA: Diagnosis not present

## 2019-02-20 MED FILL — METHOCARBAMOL 500 MG TABS: 500 | 13 days supply | Qty: 40 | Fill #0

## 2019-02-21 MED FILL — CELECOXIB 200 MG CAP: 200 | 30 days supply | Qty: 30 | Fill #0

## 2019-03-05 MED FILL — LOSARTAN POTASSIUM 100 MG T: 100 | 90 days supply | Qty: 90 | Fill #0

## 2019-03-05 MED FILL — AMLODIPINE BESYLATE 5 MG TA: 5 | 90 days supply | Qty: 90 | Fill #0

## 2019-03-06 DIAGNOSIS — M5416 Radiculopathy, lumbar region: Secondary | ICD-10-CM | POA: Diagnosis not present

## 2019-04-09 MED FILL — LEVOCETIRIZINE 5 MG TABLET: 5 | 30 days supply | Qty: 30 | Fill #1

## 2019-07-30 ENCOUNTER — Telehealth: Payer: Self-pay | Admitting: Internal Medicine

## 2019-07-30 MED ORDER — CIPROFLOXACIN HCL 500 MG PO TABS: 500.0000 mg | ORAL_TABLET | Freq: Two times a day (BID) | ORAL | 0 refills | Status: DC

## 2019-07-30 MED ORDER — METRONIDAZOLE 500 MG PO TABS: 500.0000 mg | ORAL_TABLET | Freq: Two times a day (BID) | ORAL | 0 refills | Status: DC

## 2019-07-30 MED FILL — CIPROFLOXACIN HCL 500 MG TA: 500 | 10 days supply | Qty: 20 | Fill #0

## 2019-07-30 MED FILL — metroNIDAZOLE 500 MG TABS: 500 | 10 days supply | Qty: 20 | Fill #0

## 2019-07-30 NOTE — Telephone Encounter (Signed)
Mandy Riggs, 1.  Prescribe Cipro 500 mg p.o. twice daily x10 days 2.  Prescribe metronidazole 500 mg p.o. twice daily x10 days 3.  Get her into see one of the GI advanced practitioners within 1 week.  If her symptoms worsen in the interim, she needs to go to the ER.  Thanks  Dr. Henrene Pastor

## 2019-07-30 NOTE — Telephone Encounter (Signed)
Spoke with patient and told her I would send in Cipro and Flagyl and scheduled an appointment with Alonza Bogus.  Patient agreed.

## 2019-07-30 NOTE — Telephone Encounter (Signed)
Last office visit 06/2017.  Please advise.

## 2019-08-14 ENCOUNTER — Encounter: Payer: Self-pay | Admitting: Gastroenterology

## 2019-08-14 ENCOUNTER — Ambulatory Visit: Payer: 59 | Admitting: Gastroenterology

## 2019-08-14 VITALS — BP 126/72 | HR 88 | Temp 98.1°F | Ht 63.0 in | Wt 192.0 lb

## 2019-08-14 DIAGNOSIS — K5792 Diverticulitis of intestine, part unspecified, without perforation or abscess without bleeding: Secondary | ICD-10-CM | POA: Insufficient documentation

## 2019-08-14 NOTE — Patient Instructions (Signed)
If you are age 51 or older, your body mass index should be between 23-30. Your Body mass index is 34.01 kg/m. If this is out of the aforementioned range listed, please consider follow up with your Primary Care Provider.  If you are age 11 or younger, your body mass index should be between 19-25. Your Body mass index is 34.01 kg/m. If this is out of the aformentioned range listed, please consider follow up with your Primary Care Provider.   Keep stools soft, slowly return to regular diet.   Follow up as needed.

## 2019-08-14 NOTE — Progress Notes (Signed)
08/14/2019 Mandy Riggs [id number] 03/29/1971   HISTORY OF PRESENT ILLNESS: This is a pleasant 49 year old female is a patient of Dr. Blanch Media.  She has a history of diverticulitis last treated in 2018.  Her last colonoscopy was in October 2018 at which time she was found to have one polyp that was removed and was a tubular adenoma on pathology.  Also had diverticulosis in the right and left colon and internal hemorrhoids.  She called our office on April 5 with complaints of lower abdominal pain, mostly on the left side, reminiscent of pain that she had previously with diverticulitis.  She was prescribed Cipro and Flagyl for 10 days.  She has been off those antibiotics now for the past 5 days and says that she is doing well.  Symptoms 100% resolved.   Past Medical History:  Diagnosis Date  . Acid reflux    occasionally  . Anemia   . Anxiety   . Diverticulitis   . Hypertension    states under control with meds., has been on med. x "a while", per pt.  . Mass of left axilla 06/2015  . Peptic ulcer disease   . Sickle cell trait ([place])   . Wears partial dentures    upper   Past Surgical History:  Procedure Laterality Date  . ANAL FISTULECTOMY    . BREAST BIOPSY Right    benign core  . MASS EXCISION Left 07/21/2015   Procedure: EXCISION MASS LEFT AXILLA;  Surgeon: Excell Seltzer, MD;  Location: [facility];  Service: General;  Laterality: Left;  . TONSILLECTOMY      reports that she has never smoked. She has never used smokeless tobacco. She reports that she does not drink alcohol or use drugs. family history includes Colon polyps in her mother; Diabetes in her mother; Heart disease in her maternal grandmother; Hypertension in her mother; Other in her father. Allergies  Allergen Reactions  . Latex Hives and Itching  . Ace Inhibitors Cough  . Hct [Hydrochlorothiazide] Other (See Comments)    cramping  . Hydrocodone Itching      Outpatient Encounter  Medications as of 08/14/2019  Medication Sig  . amLODipine (NORVASC) 10 MG tablet 5 mg daily.   Marland Kitchen docusate sodium (COLACE) 100 MG capsule Take 300 mg by mouth daily as needed.   Marland Kitchen esomeprazole (NEXIUM) 20 MG capsule Take 20 mg by mouth as needed.  . ferrous sulfate 325 (65 FE) MG tablet Take 325 mg by mouth daily with breakfast.  . levocetirizine (XYZAL) 5 MG tablet levocetirizine 5 mg tablet  . losartan (COZAAR) 50 MG tablet Take 50 mg by mouth daily.  Marland Kitchen VITAMIN D, ERGOCALCIFEROL, PO Take 1,000 mg by mouth daily.  . [DISCONTINUED] Ferrous Sulfate (IRON PO) Take 325 mg by mouth daily.  . cetirizine (ZYRTEC) 10 MG tablet Take 1 tablet (10 mg total) by mouth daily for 30 days.  . [DISCONTINUED] celecoxib (CELEBREX) 50 MG capsule Take 50 mg by mouth 2 (two) times daily. 20 mg 1 x per day  . [DISCONTINUED] ciprofloxacin (CIPRO) 500 MG tablet Take 1 tablet (500 mg total) by mouth 2 (two) times daily. (Patient not taking: Reported on 08/14/2019)  . [DISCONTINUED] Dextromethorphan-Menthol (DELSYM COUGH RELIEF MT) Use as directed in the mouth or throat.  . [DISCONTINUED] furosemide (LASIX) 20 MG tablet Take 20 mg by mouth.  . [DISCONTINUED] metroNIDAZOLE (FLAGYL) 500 MG tablet Take 1 tablet (500 mg total) by mouth 2 (two) times daily. (  Patient not taking: Reported on 08/14/2019)   Facility-Administered Encounter Medications as of 08/14/2019  Medication  . 0.9 %  sodium chloride infusion    REVIEW OF SYSTEMS  : All other systems reviewed and negative except where noted in the History of Present Illness.   PHYSICAL EXAM: BP 126/72   Pulse 88   Temp 98.1 F (36.7 C)   Ht 5\' 3"  (1.6 m)   Wt 192 lb (87.1 kg)   LMP 07/17/2019 (Approximate)   BMI 34.01 kg/m  General: Well developed AA female in no acute distress Head: Normocephalic and atraumatic Eyes:  Sclerae anicteric, conjunctiva pink. Ears: Normal auditory acuity Lungs: Clear throughout to auscultation; no increased WOB. Heart: Regular rate  and rhythm; no M/R/G. Abdomen: Soft, non-distended.  BS present.  Non-tender. Musculoskeletal: Symmetrical with no gross deformities  Skin: No lesions on visible extremities Extremities: No edema  Neurological: Alert oriented x 4, grossly non-focal  Psychological:  Alert and cooperative. Normal mood and affect  ASSESSMENT AND PLAN: *Diverticulitis:  Resolved after 10 days of cipro and flagyl.  Symptoms 100% resolved.  Advised to slowly return to her regular diet with increased dietary fiber including salads, fruits and vegetables, etc.  Will return on an as-needed basis for any other symptoms or issues.   CC:  Antony Contras, MD

## 2019-08-15 NOTE — Progress Notes (Signed)
Noted  

## 2019-08-27 DIAGNOSIS — Z01419 Encounter for gynecological examination (general) (routine) without abnormal findings: Secondary | ICD-10-CM | POA: Diagnosis not present

## 2019-08-29 DIAGNOSIS — Z01419 Encounter for gynecological examination (general) (routine) without abnormal findings: Secondary | ICD-10-CM | POA: Diagnosis not present

## 2019-10-08 ENCOUNTER — Other Ambulatory Visit: Payer: Self-pay | Admitting: Family Medicine

## 2019-10-08 DIAGNOSIS — Z1231 Encounter for screening mammogram for malignant neoplasm of breast: Secondary | ICD-10-CM

## 2019-10-30 ENCOUNTER — Other Ambulatory Visit: Payer: Self-pay

## 2019-10-30 ENCOUNTER — Ambulatory Visit
Admission: RE | Admit: 2019-10-30 | Discharge: 2019-10-30 | Disposition: A | Discharge status: Home / Self Care | Payer: 59 | Source: Ambulatory Visit | Attending: Family Medicine | Admitting: Family Medicine

## 2019-10-30 DIAGNOSIS — Z1231 Encounter for screening mammogram for malignant neoplasm of breast: Secondary | ICD-10-CM | POA: Diagnosis not present

## 2019-11-08 ENCOUNTER — Other Ambulatory Visit (HOSPITAL_COMMUNITY): Payer: Self-pay | Admitting: Family Medicine

## 2019-11-08 DIAGNOSIS — Z13220 Encounter for screening for lipoid disorders: Secondary | ICD-10-CM | POA: Diagnosis not present

## 2019-11-08 DIAGNOSIS — F419 Anxiety disorder, unspecified: Secondary | ICD-10-CM | POA: Diagnosis not present

## 2019-11-08 DIAGNOSIS — R609 Edema, unspecified: Secondary | ICD-10-CM | POA: Diagnosis not present

## 2019-11-08 DIAGNOSIS — J302 Other seasonal allergic rhinitis: Secondary | ICD-10-CM | POA: Diagnosis not present

## 2019-11-08 DIAGNOSIS — Z0000 Encounter for general adult medical examination without abnormal findings: Secondary | ICD-10-CM | POA: Diagnosis not present

## 2019-11-08 DIAGNOSIS — Z8719 Personal history of other diseases of the digestive system: Secondary | ICD-10-CM | POA: Diagnosis not present

## 2019-11-08 DIAGNOSIS — Z862 Personal history of diseases of the blood and blood-forming organs and certain disorders involving the immune mechanism: Secondary | ICD-10-CM | POA: Diagnosis not present

## 2019-11-08 DIAGNOSIS — E559 Vitamin D deficiency, unspecified: Secondary | ICD-10-CM | POA: Diagnosis not present

## 2019-11-08 DIAGNOSIS — I10 Essential (primary) hypertension: Secondary | ICD-10-CM | POA: Diagnosis not present

## 2019-11-08 DIAGNOSIS — K219 Gastro-esophageal reflux disease without esophagitis: Secondary | ICD-10-CM | POA: Diagnosis not present

## 2019-11-08 MED FILL — AMLODIPINE BESYLATE 5 MG TA: 5 | 90 days supply | Qty: 90 | Fill #0

## 2019-11-08 MED FILL — LEVOCETIRIZINE 5 MG TABLET: 5 | 30 days supply | Qty: 30 | Fill #0

## 2019-11-08 MED FILL — FLUTICASONE PROP 50 MCG SPR: 50 | 30 days supply | Qty: 16 | Fill #0

## 2019-11-08 MED FILL — LOSARTAN POTASSIUM 100 MG T: 100 | 90 days supply | Qty: 90 | Fill #0

## 2020-02-25 ENCOUNTER — Other Ambulatory Visit (HOSPITAL_COMMUNITY): Payer: Self-pay | Admitting: Unknown Physician Specialty

## 2020-02-25 DIAGNOSIS — N912 Amenorrhea, unspecified: Secondary | ICD-10-CM | POA: Diagnosis not present

## 2020-02-25 MED FILL — TRANEXAMIC ACID 650 MG TAB: 650 | 5 days supply | Qty: 15 | Fill #0

## 2020-04-01 ENCOUNTER — Telehealth: Payer: 59 | Admitting: Emergency Medicine

## 2020-04-01 ENCOUNTER — Other Ambulatory Visit: Payer: Self-pay | Admitting: Emergency Medicine

## 2020-04-01 DIAGNOSIS — R059 Cough, unspecified: Secondary | ICD-10-CM | POA: Diagnosis not present

## 2020-04-01 MED ORDER — FLUTICASONE PROPIONATE 50 MCG/ACT NA SUSP: 2.0000 | Freq: Every day | NASAL | 0 refills | Status: DC

## 2020-04-01 MED ORDER — BENZONATATE 100 MG PO CAPS: 100.0000 mg | ORAL_CAPSULE | Freq: Two times a day (BID) | ORAL | 0 refills | Status: DC | PRN

## 2020-04-01 MED FILL — FLUTICASONE PROP 50 MCG SPR: 50 | 30 days supply | Qty: 16 | Fill #0

## 2020-04-01 MED FILL — BENZONATATE 100 MG CAPS: 100 | 10 days supply | Qty: 20 | Fill #0

## 2020-04-01 NOTE — Progress Notes (Signed)
We are sorry that you are not feeling well.  Here is how we plan to help!  Based on your presentation I believe you most likely have A cough due to a virus.  This is called viral bronchitis and is best treated by rest, plenty of fluids and control of the cough.  You may use Ibuprofen or Tylenol as directed to help your symptoms.     In addition you may use A prescription cough medication called Tessalon Perles 100mg. You may take 1-2 capsules every 8 hours as needed for your cough.    From your responses in the eVisit questionnaire you describe inflammation in the upper respiratory tract which is causing a significant cough.  This is commonly called Bronchitis and has four common causes:    Allergies  Viral Infections  Acid Reflux  Bacterial Infection Allergies, viruses and acid reflux are treated by controlling symptoms or eliminating the cause. An example might be a cough caused by taking certain blood pressure medications. You stop the cough by changing the medication. Another example might be a cough caused by acid reflux. Controlling the reflux helps control the cough.  USE OF BRONCHODILATOR ("RESCUE") INHALERS: There is a risk from using your bronchodilator too frequently.  The risk is that over-reliance on a medication which only relaxes the muscles surrounding the breathing tubes can reduce the effectiveness of medications prescribed to reduce swelling and congestion of the tubes themselves.  Although you feel brief relief from the bronchodilator inhaler, your asthma may actually be worsening with the tubes becoming more swollen and filled with mucus.  This can delay other crucial treatments, such as oral steroid medications. If you need to use a bronchodilator inhaler daily, several times per day, you should discuss this with your provider.  There are probably better treatments that could be used to keep your asthma under control.     HOME CARE . Only take medications as instructed by  your medical team. . Complete the entire course of an antibiotic. . Drink plenty of fluids and get plenty of rest. . Avoid close contacts especially the very young and the elderly . Cover your mouth if you cough or cough into your sleeve. . Always remember to wash your hands . A steam or ultrasonic humidifier can help congestion.   GET HELP RIGHT AWAY IF: . You develop worsening fever. . You become short of breath . You cough up blood. . Your symptoms persist after you have completed your treatment plan MAKE SURE YOU   Understand these instructions.  Will watch your condition.  Will get help right away if you are not doing well or get worse.  Your e-visit answers were reviewed by a board certified advanced clinical practitioner to complete your personal care plan.  Depending on the condition, your plan could have included both over the counter or prescription medications. If there is a problem please reply  once you have received a response from your provider. Your safety is important to us.  If you have drug allergies check your prescription carefully.    You can use MyChart to ask questions about today's visit, request a non-urgent call back, or ask for a work or school excuse for 24 hours related to this e-Visit. If it has been greater than 24 hours you will need to follow up with your provider, or enter a new e-Visit to address those concerns. You will get an e-mail in the next two days asking about your experience.    I hope that your e-visit has been valuable and will speed your recovery. Thank you for using e-visits.  Approximately 5 minutes was used in reviewing the patient's chart, questionnaire, prescribing medications, and documentation.

## 2020-04-02 MED FILL — TRANEXAMIC ACID 650 MG TAB: 650 | 5 days supply | Qty: 15 | Fill #1

## 2020-04-03 MED FILL — LEVOCETIRIZINE 5 MG TABLET: 5 | 30 days supply | Qty: 30 | Fill #1

## 2020-04-08 ENCOUNTER — Telehealth: Payer: 59 | Admitting: Physician Assistant

## 2020-04-08 ENCOUNTER — Other Ambulatory Visit: Payer: Self-pay | Admitting: Physician Assistant

## 2020-04-08 DIAGNOSIS — K0889 Other specified disorders of teeth and supporting structures: Secondary | ICD-10-CM | POA: Diagnosis not present

## 2020-04-08 MED ORDER — AMOXICILLIN 500 MG PO CAPS: 500.0000 mg | ORAL_CAPSULE | Freq: Three times a day (TID) | ORAL | 0 refills | Status: DC

## 2020-04-08 MED ORDER — IBUPROFEN 600 MG PO TABS: 600.0000 mg | ORAL_TABLET | Freq: Three times a day (TID) | ORAL | 0 refills | Status: DC | PRN

## 2020-04-08 MED FILL — IBUPROFEN 600 MG TABLET: 600 | 10 days supply | Qty: 30 | Fill #0

## 2020-04-08 MED FILL — AMOXICILLIN 500 MG CAPSULE: 500 | 10 days supply | Qty: 30 | Fill #0

## 2020-04-08 NOTE — Progress Notes (Signed)
E-Visit for Dental Pain  We are sorry that you are not feeling well.  Here is how we plan to help!  Based on what you have shared with me in the questionnaire, it sounds like you have dental pain.  Ibuprofen 600mg  3 times a day for 7 days for discomfort and Amoxicillin 500mg  3 times per day for 10 days  It is imperative that you see a dentist within 10 days of this eVisit to determine the cause of the dental pain and be sure it is adequately treated  A toothache or tooth pain is caused when the nerve in the root of a tooth or surrounding a tooth is irritated. Dental (tooth) infection, decay, injury, or loss of a tooth are the most common causes of dental pain. Pain may also occur after an extraction (tooth is pulled out). Pain sometimes originates from other areas and radiates to the jaw, thus appearing to be tooth pain.Bacteria growing inside your mouth can contribute to gum disease and dental decay, both of which can cause pain. A toothache occurs from inflammation of the central portion of the tooth called pulp. The pulp contains nerve endings that are very sensitive to pain. Inflammation to the pulp or pulpitis may be caused by dental cavities, trauma, and infection.    HOME CARE:   For toothaches: . Over-the-counter pain medications such as acetaminophen or ibuprofen may be used. Take these as directed on the package while you arrange for a dental appointment. . Avoid very cold or hot foods, because they may make the pain worse. . You may get relief from biting on a cotton ball soaked in oil of cloves. You can get oil of cloves at most drug stores.  For jaw pain: .  Aspirin may be helpful for problems in the joint of the jaw in adults. . If pain happens every time you open your mouth widely, the temporomandibular joint (TMJ) may be the source of the pain. Yawning or taking a large bite of food may worsen the pain. An appointment with your doctor or dentist will help you find the cause.      GET HELP RIGHT AWAY IF:  . You have a high fever or chills . If you have had a recent head or face injury and develop headache, light headedness, nausea, vomiting, or other symptoms that concern you after an injury to your face or mouth, you could have a more serious injury in addition to your dental injury. . A facial rash associated with a toothache: This condition may improve with medication. Contact your doctor for them to decide what is appropriate. . Any jaw pain occurring with chest pain: Although jaw pain is most commonly caused by dental disease, it is sometimes referred pain from other areas. People with heart disease, especially people who have had stents placed, people with diabetes, or those who have had heart surgery may have jaw pain as a symptom of heart attack or angina. If your jaw or tooth pain is associated with lightheadedness, sweating, or shortness of breath, you should see a doctor as soon as possible. . Trouble swallowing or excessive pain or bleeding from gums: If you have a history of a weakened immune system, diabetes, or steroid use, you may be more susceptible to infections. Infections can often be more severe and extensive or caused by unusual organisms. Dental and gum infections in people with these conditions may require more aggressive treatment. An abscess may need draining or IV antibiotics, for  example.  MAKE SURE YOU    Understand these instructions.  Will watch your condition.  Will get help right away if you are not doing well or get worse.  Thank you for choosing an e-visit. Your e-visit answers were reviewed by a board certified advanced clinical practitioner to complete your personal care plan. Depending upon the condition, your plan could have included both over the counter or prescription medications. Please review your pharmacy choice. Make sure the pharmacy is open so you can pick up prescription now. If there is a problem, you may contact your  provider through [employer] and have the prescription routed to another pharmacy. Your safety is important to [place]. If you have drug allergies check your prescription carefully.  For the next 24 hours you can use MyChart to ask questions about today's visit, request a non-urgent call back, or ask for a work or school excuse. You will get an email in the next two days asking about your experience. I hope that your e-visit has been valuable and will speed your recovery.   Greater than 5 minutes, yet less than 10 minutes of time have been spent researching, coordinating, and implementing care for this patient today

## 2020-04-10 ENCOUNTER — Telehealth: Payer: 59 | Admitting: Orthopedic Surgery

## 2020-04-10 DIAGNOSIS — R059 Cough, unspecified: Secondary | ICD-10-CM | POA: Diagnosis not present

## 2020-04-10 MED ORDER — BENZONATATE 100 MG PO CAPS: 100.0000 mg | ORAL_CAPSULE | Freq: Two times a day (BID) | ORAL | 0 refills | Status: DC | PRN

## 2020-04-10 MED ORDER — ALBUTEROL SULFATE HFA 108 (90 BASE) MCG/ACT IN AERS: 2.0000 | INHALATION_SPRAY | Freq: Four times a day (QID) | RESPIRATORY_TRACT | 0 refills | Status: DC | PRN

## 2020-04-10 MED ORDER — PREDNISONE 20 MG PO TABS: 40.0000 mg | ORAL_TABLET | Freq: Every day | ORAL | 0 refills | Status: AC

## 2020-04-10 NOTE — Progress Notes (Signed)
We are sorry that you are not feeling well.  Here is how we plan to help!  Based on your presentation I believe you most likely have A cough due to a virus.  This is called viral bronchitis and is best treated by rest, plenty of fluids and control of the cough.  You may use Ibuprofen or Tylenol as directed to help your symptoms.     In addition you may use A prescription cough medication called Tessalon Perles 100mg . You may take 1-2 capsules every 8 hours as needed for your cough. I have refilled this. I will also prescribe an inhaler to use, albuterol 2 puffs every 6 hours as needed.  I'll also prescribe a short burst of steroids to help with the inflammation. Take Prednisone 40mg  daily for 5 days.  THIS COULD BE FROM A COVID INFECTION. YOU SHOULD QUARANTINE FOR 10 DAYS FROM THE START OF YOUR SYMPTOMS. YOU MAY HAVE ALREADY REACHED THAT.  From your responses in the eVisit questionnaire you describe inflammation in the upper respiratory tract which is causing a significant cough.  This is commonly called Bronchitis and has four common causes:    Allergies  Viral Infections  Acid Reflux  Bacterial Infection Allergies, viruses and acid reflux are treated by controlling symptoms or eliminating the cause. An example might be a cough caused by taking certain blood pressure medications. You stop the cough by changing the medication. Another example might be a cough caused by acid reflux. Controlling the reflux helps control the cough.     HOME CARE . Only take medications as instructed by your medical team. . Complete the entire course of an antibiotic. . Drink plenty of fluids and get plenty of rest. . Avoid close contacts especially the very young and the elderly . Cover your mouth if you cough or cough into your sleeve. . Always remember to wash your hands . A steam or ultrasonic humidifier can help congestion.   GET HELP RIGHT AWAY IF: . You develop worsening fever. . You become short  of breath . You cough up blood. . Your symptoms persist after you have completed your treatment plan MAKE SURE YOU   Understand these instructions.  Will watch your condition.  Will get help right away if you are not doing well or get worse.  Your e-visit answers were reviewed by a board certified advanced clinical practitioner to complete your personal care plan.  Depending on the condition, your plan could have included both over the counter or prescription medications. If there is a problem please reply  once you have received a response from your provider. Your safety is important to [place].  If you have drug allergies check your prescription carefully.    You can use MyChart to ask questions about today's visit, request a non-urgent call back, or ask for a work or school excuse for 24 hours related to this e-Visit. If it has been greater than 24 hours you will need to follow up with your provider, or enter a new e-Visit to address those concerns. You will get an e-mail in the next two days asking about your experience.  I hope that your e-visit has been valuable and will speed your recovery. Thank you for using e-visits.  Greater than 5 minutes, yet less than 10 minutes of time have been spent researching, coordinating and implementing care for this patient today.

## 2020-06-05 MED FILL — TRANEXAMIC ACID 650 MG TAB: 650 | 5 days supply | Qty: 15 | Fill #2

## 2020-06-30 MED FILL — LOSARTAN POTASSIUM 100 MG T: 100 | 30 days supply | Qty: 30 | Fill #1

## 2020-06-30 MED FILL — AMLODIPINE BESYLATE 5 MG TA: 5 | 90 days supply | Qty: 90 | Fill #1

## 2020-07-24 MED FILL — LEVOCETIRIZINE 5 MG TABLET: 5 | 30 days supply | Qty: 30 | Fill #2

## 2020-08-01 ENCOUNTER — Telehealth: Payer: 59 | Admitting: Family

## 2020-08-01 ENCOUNTER — Other Ambulatory Visit (HOSPITAL_COMMUNITY): Payer: Self-pay

## 2020-08-01 DIAGNOSIS — J301 Allergic rhinitis due to pollen: Secondary | ICD-10-CM | POA: Diagnosis not present

## 2020-08-01 MED ORDER — MONTELUKAST SODIUM 10 MG PO TABS
10.0000 mg | ORAL_TABLET | Freq: Every day | ORAL | 3 refills | Status: DC
  Filled 2020-08-01: qty 30, 30d supply, fill #0

## 2020-08-01 MED ORDER — LEVOCETIRIZINE DIHYDROCHLORIDE 5 MG PO TABS
5.0000 mg | ORAL_TABLET | Freq: Every evening | ORAL | 1 refills | Status: DC
  Filled 2020-08-01 – 2021-01-26 (×2): qty 90, 90d supply, fill #0
  Filled 2021-05-13: qty 90, 90d supply, fill #1

## 2020-08-01 NOTE — Progress Notes (Signed)
E visit for Allergic Rhinitis We are sorry that you are not feeling well.  Here is how we plan to help!  Based on what you have shared with me it looks like you have Allergic Rhinitis.  Rhinitis is when a reaction occurs that causes nasal congestion, runny nose, sneezing, and itching.  Most types of rhinitis are caused by an inflammation and are associated with symptoms in the eyes ears or throat. There are several types of rhinitis.  The most common are acute rhinitis, which is usually caused by a viral illness, allergic or seasonal rhinitis, and nonallergic or year-round rhinitis.  Nasal allergies occur certain times of the year.  Allergic rhinitis is caused when allergens in the air trigger the release of histamine in the body.  Histamine causes itching, swelling, and fluid to build up in the fragile linings of the nasal passages, sinuses and eyelids.  An itchy nose and clear discharge are common.  I recommend the following over the counter treatments: Xyzal 5 mg take 1 tablet daily  I also would recommend a nasal spray: Flonase 2 sprays into each nostril once daily  I have also added Singulair 10 mg. This is a good allergy medication and can help with asthma. If this continues with all of these, you may want to see an allergen specialists.   HOME CARE:   You can use an over-the-counter saline nasal spray as needed  Avoid areas where there is heavy dust, mites, or molds  Stay indoors on windy days during the pollen season  Keep windows closed in home, at least in bedroom; use air conditioner.  Use high-efficiency house air filter  Keep windows closed in car, turn AC on re-circulate  Avoid playing out with dog during pollen season  GET HELP RIGHT AWAY IF:   If your symptoms do not improve within 10 days  You become short of breath  You develop yellow or green discharge from your nose for over 3 days  You have coughing fits  MAKE SURE YOU:   Understand these  instructions  Will watch your condition  Will get help right away if you are not doing well or get worse  Thank you for choosing an e-visit. Your e-visit answers were reviewed by a board certified advanced clinical practitioner to complete your personal care plan. Depending upon the condition, your plan could have included both over the counter or prescription medications. Please review your pharmacy choice. Be sure that the pharmacy you have chosen is open so that you can pick up your prescription now.  If there is a problem you may message your provider in [place] to have the prescription routed to another pharmacy. Your safety is important to [place]. If you have drug allergies check your prescription carefully.  For the next 24 hours, you can use MyChart to ask questions about today's visit, request a non-urgent call back, or ask for a work or school excuse from your e-visit provider. You will get an email in the next two days asking about your experience. I hope that your e-visit has been valuable and will speed your recovery.      Approximately 5 minutes was spent documenting and reviewing patient's chart.

## 2020-08-07 ENCOUNTER — Other Ambulatory Visit (HOSPITAL_COMMUNITY): Payer: Self-pay

## 2020-08-07 ENCOUNTER — Telehealth: Payer: 59 | Admitting: Physician Assistant

## 2020-08-07 DIAGNOSIS — R058 Other specified cough: Secondary | ICD-10-CM | POA: Diagnosis not present

## 2020-08-07 DIAGNOSIS — J9801 Acute bronchospasm: Secondary | ICD-10-CM | POA: Diagnosis not present

## 2020-08-07 MED ORDER — BENZONATATE 100 MG PO CAPS
100.0000 mg | ORAL_CAPSULE | Freq: Three times a day (TID) | ORAL | 0 refills | Status: DC | PRN
  Filled 2020-08-07: qty 30, 10d supply, fill #0

## 2020-08-07 MED ORDER — PREDNISONE 10 MG PO TABS
ORAL_TABLET | ORAL | 0 refills | Status: DC
  Filled 2020-08-07: qty 21, 6d supply, fill #0

## 2020-08-07 NOTE — Progress Notes (Signed)
We are sorry that you are not feeling well.  Here is how we plan to help!  Based on your presentation I believe you most likely have A cough due to allergies.  I recommend that you start the an over-the counter-allergy medication such as Claritin 10 mg or Zyrtec 10 mg daily.     In addition you may use A prescription cough medication called Tessalon Perles 100mg . You may take 1-2 capsules every 8 hours as needed for your cough.  Prednisone 10 mg daily for 6 days (see taper instructions below)  Directions for 6 day taper: Day 1: 2 tablets before breakfast, 1 after both lunch & dinner and 2 at bedtime Day 2: 1 tab before breakfast, 1 after both lunch & dinner and 2 at bedtime Day 3: 1 tab at each meal & 1 at bedtime Day 4: 1 tab at breakfast, 1 at lunch, 1 at bedtime Day 5: 1 tab at breakfast & 1 tab at bedtime Day 6: 1 tab at breakfast   From your responses in the eVisit questionnaire you describe inflammation in the upper respiratory tract which is causing a significant cough.  This is commonly called Bronchitis and has four common causes:    Allergies  Viral Infections  Acid Reflux  Bacterial Infection Allergies, viruses and acid reflux are treated by controlling symptoms or eliminating the cause. An example might be a cough caused by taking certain blood pressure medications. You stop the cough by changing the medication. Another example might be a cough caused by acid reflux. Controlling the reflux helps control the cough.  USE OF BRONCHODILATOR ("RESCUE") INHALERS: There is a risk from using your bronchodilator too frequently.  The risk is that over-reliance on a medication which only relaxes the muscles surrounding the breathing tubes can reduce the effectiveness of medications prescribed to reduce swelling and congestion of the tubes themselves.  Although you feel brief relief from the bronchodilator inhaler, your asthma may actually be worsening with the tubes becoming more  swollen and filled with mucus.  This can delay other crucial treatments, such as oral steroid medications. If you need to use a bronchodilator inhaler daily, several times per day, you should discuss this with your provider.  There are probably better treatments that could be used to keep your asthma under control.     HOME CARE . Only take medications as instructed by your medical team. . Complete the entire course of an antibiotic. . Drink plenty of fluids and get plenty of rest. . Avoid close contacts especially the very young and the elderly . Cover your mouth if you cough or cough into your sleeve. . Always remember to wash your hands . A steam or ultrasonic humidifier can help congestion.   GET HELP RIGHT AWAY IF: . You develop worsening fever. . You become short of breath . You cough up blood. . Your symptoms persist after you have completed your treatment plan MAKE SURE YOU   Understand these instructions.  Will watch your condition.  Will get help right away if you are not doing well or get worse.  Your e-visit answers were reviewed by a board certified advanced clinical practitioner to complete your personal care plan.  Depending on the condition, your plan could have included both over the counter or prescription medications. If there is a problem please reply  once you have received a response from your provider. Your safety is important to [place].  If you have drug allergies check your  prescription carefully.    You can use MyChart to ask questions about today's visit, request a non-urgent call back, or ask for a work or school excuse for 24 hours related to this e-Visit. If it has been greater than 24 hours you will need to follow up with your provider, or enter a new e-Visit to address those concerns. You will get an e-mail in the next two days asking about your experience.  I hope that your e-visit has been valuable and will speed your recovery. Thank you for using  e-visits.

## 2020-08-07 NOTE — Progress Notes (Signed)
I have spent 5 minutes in review of e-visit questionnaire, review and updating patient chart, medical decision making and response to patient.   Delanie Tirrell Cody Donnarae Rae, PA-C    

## 2020-08-20 ENCOUNTER — Other Ambulatory Visit (HOSPITAL_COMMUNITY): Payer: Self-pay

## 2020-08-20 MED FILL — Losartan Potassium Tab 100 MG: ORAL | 90 days supply | Qty: 90 | Fill #0 | Status: AC

## 2020-09-16 ENCOUNTER — Other Ambulatory Visit: Payer: Self-pay | Admitting: Family Medicine

## 2020-09-16 DIAGNOSIS — Z1231 Encounter for screening mammogram for malignant neoplasm of breast: Secondary | ICD-10-CM

## 2020-09-25 ENCOUNTER — Telehealth: Payer: Self-pay | Admitting: Gastroenterology

## 2020-09-25 NOTE — Telephone Encounter (Signed)
Inbound call from patient stating she is having a diverticulitis flare-up.  Please advise.

## 2020-09-25 NOTE — Telephone Encounter (Signed)
The pt called to report abd cramping on the lower left side with nausea.   BM provides some relief with loose stools.  She has a history of diverticulitis. She says the pain is the same as previous diverticulitis flare.  She has a follow up on  11/13/20 appt with Dr Henrene Pastor. She would like to know if she can have antibiotics and something for cramping. Please advise

## 2020-09-26 ENCOUNTER — Other Ambulatory Visit (HOSPITAL_COMMUNITY): Payer: Self-pay

## 2020-09-26 MED ORDER — CIPROFLOXACIN HCL 500 MG PO TABS
500.0000 mg | ORAL_TABLET | Freq: Two times a day (BID) | ORAL | 0 refills | Status: AC
  Filled 2020-09-26: qty 28, 14d supply, fill #0

## 2020-09-26 MED ORDER — METRONIDAZOLE 500 MG PO TABS
500.0000 mg | ORAL_TABLET | Freq: Three times a day (TID) | ORAL | 0 refills | Status: AC
  Filled 2020-09-26: qty 42, 14d supply, fill #0

## 2020-09-26 MED ORDER — HYOSCYAMINE SULFATE SL 0.125 MG SL SUBL
1.0000 | SUBLINGUAL_TABLET | Freq: Four times a day (QID) | SUBLINGUAL | 1 refills | Status: DC | PRN
  Filled 2020-09-26: qty 30, 8d supply, fill #0

## 2020-09-26 NOTE — Telephone Encounter (Signed)
The pt has been advised and prescription has been sent to the pharmacy.  She will keep appt with Dr Henrene Pastor as planned.

## 2020-09-26 NOTE — Telephone Encounter (Signed)
Left message on machine to call back  

## 2020-11-03 DIAGNOSIS — M25511 Pain in right shoulder: Secondary | ICD-10-CM | POA: Diagnosis not present

## 2020-11-03 DIAGNOSIS — G8911 Acute pain due to trauma: Secondary | ICD-10-CM | POA: Diagnosis not present

## 2020-11-10 ENCOUNTER — Other Ambulatory Visit (HOSPITAL_COMMUNITY): Payer: Self-pay

## 2020-11-10 DIAGNOSIS — F419 Anxiety disorder, unspecified: Secondary | ICD-10-CM | POA: Diagnosis not present

## 2020-11-10 DIAGNOSIS — M25511 Pain in right shoulder: Secondary | ICD-10-CM | POA: Diagnosis not present

## 2020-11-10 DIAGNOSIS — J302 Other seasonal allergic rhinitis: Secondary | ICD-10-CM | POA: Diagnosis not present

## 2020-11-10 DIAGNOSIS — I10 Essential (primary) hypertension: Secondary | ICD-10-CM | POA: Diagnosis not present

## 2020-11-10 DIAGNOSIS — Z0000 Encounter for general adult medical examination without abnormal findings: Secondary | ICD-10-CM | POA: Diagnosis not present

## 2020-11-10 DIAGNOSIS — K219 Gastro-esophageal reflux disease without esophagitis: Secondary | ICD-10-CM | POA: Diagnosis not present

## 2020-11-10 DIAGNOSIS — E559 Vitamin D deficiency, unspecified: Secondary | ICD-10-CM | POA: Diagnosis not present

## 2020-11-10 DIAGNOSIS — R609 Edema, unspecified: Secondary | ICD-10-CM | POA: Diagnosis not present

## 2020-11-10 DIAGNOSIS — Z8719 Personal history of other diseases of the digestive system: Secondary | ICD-10-CM | POA: Diagnosis not present

## 2020-11-10 MED ORDER — LEVOCETIRIZINE DIHYDROCHLORIDE 5 MG PO TABS
5.0000 mg | ORAL_TABLET | Freq: Every evening | ORAL | 2 refills | Status: DC
  Filled 2020-11-10: qty 30, 30d supply, fill #0

## 2020-11-10 MED ORDER — FLUTICASONE PROPIONATE 50 MCG/ACT NA SUSP
1.0000 | Freq: Two times a day (BID) | NASAL | 2 refills | Status: DC
  Filled 2020-11-10: qty 16, 30d supply, fill #0
  Filled 2021-01-26: qty 16, 30d supply, fill #1
  Filled 2021-10-14: qty 16, 30d supply, fill #2

## 2020-11-10 MED ORDER — ALPRAZOLAM 0.5 MG PO TABS
0.2500 mg | ORAL_TABLET | Freq: Two times a day (BID) | ORAL | 0 refills | Status: DC
  Filled 2020-11-10: qty 10, 5d supply, fill #0

## 2020-11-10 MED ORDER — LOSARTAN POTASSIUM 100 MG PO TABS
100.0000 mg | ORAL_TABLET | Freq: Every day | ORAL | 3 refills | Status: DC
  Filled 2020-11-10: qty 90, 90d supply, fill #0
  Filled 2021-01-26: qty 90, 90d supply, fill #1
  Filled 2021-05-13: qty 90, 90d supply, fill #2
  Filled 2021-10-14: qty 90, 90d supply, fill #3

## 2020-11-10 MED ORDER — AMLODIPINE BESYLATE 5 MG PO TABS
5.0000 mg | ORAL_TABLET | Freq: Every day | ORAL | 3 refills | Status: DC
  Filled 2020-11-10: qty 90, 90d supply, fill #0
  Filled 2021-01-26: qty 90, 90d supply, fill #1
  Filled 2021-05-13: qty 90, 90d supply, fill #2
  Filled 2021-10-14: qty 90, 90d supply, fill #3

## 2020-11-13 ENCOUNTER — Ambulatory Visit: Payer: 59 | Admitting: Internal Medicine

## 2020-11-17 ENCOUNTER — Other Ambulatory Visit: Payer: Self-pay

## 2020-11-17 ENCOUNTER — Ambulatory Visit
Admission: RE | Admit: 2020-11-17 | Discharge: 2020-11-17 | Disposition: A | Discharge status: Home / Self Care | Payer: 59 | Source: Ambulatory Visit | Attending: Family Medicine | Admitting: Family Medicine

## 2020-11-17 DIAGNOSIS — Z1231 Encounter for screening mammogram for malignant neoplasm of breast: Secondary | ICD-10-CM

## 2020-11-20 ENCOUNTER — Other Ambulatory Visit: Payer: Self-pay | Admitting: Family Medicine

## 2020-11-20 DIAGNOSIS — R928 Other abnormal and inconclusive findings on diagnostic imaging of breast: Secondary | ICD-10-CM

## 2020-11-21 ENCOUNTER — Other Ambulatory Visit (HOSPITAL_COMMUNITY): Payer: Self-pay

## 2020-11-21 MED ORDER — ZOSTER VAC RECOMB ADJUVANTED 50 MCG/0.5ML IM SUSR
INTRAMUSCULAR | 1 refills | Status: DC
  Filled 2020-11-21: qty 0.5, 1d supply, fill #0
  Filled 2021-01-26: qty 0.5, 1d supply, fill #1

## 2020-11-25 ENCOUNTER — Other Ambulatory Visit (HOSPITAL_COMMUNITY): Payer: Self-pay

## 2020-12-05 ENCOUNTER — Other Ambulatory Visit (HOSPITAL_COMMUNITY): Payer: Self-pay

## 2020-12-05 MED ORDER — ALPRAZOLAM 0.5 MG PO TABS
0.2500 mg | ORAL_TABLET | Freq: Two times a day (BID) | ORAL | 0 refills | Status: DC | PRN
  Filled 2020-12-05: qty 10, 5d supply, fill #0

## 2020-12-08 ENCOUNTER — Ambulatory Visit
Admission: RE | Admit: 2020-12-08 | Discharge: 2020-12-08 | Disposition: A | Discharge status: Home / Self Care | Payer: 59 | Source: Ambulatory Visit | Attending: Family Medicine | Admitting: Family Medicine

## 2020-12-08 ENCOUNTER — Other Ambulatory Visit: Payer: Self-pay | Admitting: Family Medicine

## 2020-12-08 ENCOUNTER — Other Ambulatory Visit: Payer: Self-pay

## 2020-12-08 DIAGNOSIS — R928 Other abnormal and inconclusive findings on diagnostic imaging of breast: Secondary | ICD-10-CM

## 2020-12-08 DIAGNOSIS — R921 Mammographic calcification found on diagnostic imaging of breast: Secondary | ICD-10-CM

## 2020-12-23 ENCOUNTER — Ambulatory Visit: Payer: 59 | Admitting: Internal Medicine

## 2020-12-23 ENCOUNTER — Other Ambulatory Visit (HOSPITAL_COMMUNITY): Payer: Self-pay

## 2020-12-23 ENCOUNTER — Encounter: Payer: Self-pay | Admitting: Internal Medicine

## 2020-12-23 VITALS — BP 104/60 | HR 77 | Ht 63.0 in | Wt 186.0 lb

## 2020-12-23 DIAGNOSIS — K5792 Diverticulitis of intestine, part unspecified, without perforation or abscess without bleeding: Secondary | ICD-10-CM | POA: Diagnosis not present

## 2020-12-23 DIAGNOSIS — Z86010 Personal history of colonic polyps: Secondary | ICD-10-CM

## 2020-12-23 DIAGNOSIS — K219 Gastro-esophageal reflux disease without esophagitis: Secondary | ICD-10-CM

## 2020-12-23 DIAGNOSIS — K5901 Slow transit constipation: Secondary | ICD-10-CM

## 2020-12-23 MED ORDER — HYOSCYAMINE SULFATE 0.125 MG SL SUBL
0.1250 mg | SUBLINGUAL_TABLET | SUBLINGUAL | 6 refills | Status: DC | PRN
Start: 2020-12-23 — End: 2022-06-28
  Filled 2020-12-23: qty 30, 5d supply, fill #0
  Filled 2021-09-07: qty 30, 5d supply, fill #1

## 2020-12-23 NOTE — Progress Notes (Signed)
HISTORY OF PRESENT ILLNESS:  Mandy Riggs is a 50 y.o. 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  The patient contacted the office September 25, 2020 with left lower quadrant pain consistent with a previous bout of diverticulitis.  She was treated with ciprofloxacin and metronidazole.  As well, given sublingual Levsin for pain.  This follow-up appointment made.  She tells me that her pain resolved during the course of antibiotic therapy.  She has found Levsin helpful for nonspecific abdominal cramping.  She continues with occasional constipation for which she uses stool softeners and MiraLAX.  Her last complete colonoscopy was performed October 2018.  In addition to diverticulosis she was found to have an adenomatous colon polyp.  Follow-up in 5 years recommended.  She also has a history of GERD for which she takes Nexium.  This helps.  REVIEW OF SYSTEMS:  All non-GI ROS negative unless otherwise stated in the HPI.  Past Medical History:  Diagnosis Date   Acid reflux    occasionally   Anemia    Anxiety    Diverticulitis    Hypertension    states under control with meds., has been on med. x "a while", per pt.   Mass of left axilla 06/2015   Peptic ulcer disease    Sickle cell trait ([place])    Wears partial dentures    upper    Past Surgical History:  Procedure Laterality Date   ANAL FISTULECTOMY     BREAST BIOPSY Right    benign core   MASS EXCISION Left 07/21/2015   Procedure: EXCISION MASS LEFT AXILLA;  Surgeon: Excell Seltzer, MD;  Location: [place];  Service: General;  Laterality: Left;   TONSILLECTOMY      Social History Mandy Riggs  reports that she has never smoked. She has never used smokeless tobacco. She reports that she does not drink alcohol and does not use drugs.  family history includes Colon polyps in her mother;  Diabetes in her mother; Heart disease in her maternal grandmother; Hypertension in her mother; Other in her father.  Allergies  Allergen Reactions   Latex Hives and Itching   Ace Inhibitors Cough   Hct [Hydrochlorothiazide] Other (See Comments)    cramping   Hydrocodone Itching       PHYSICAL EXAMINATION: Vital signs: BP 104/60   Pulse 77   Ht '5\' 3"'$  (1.6 m)   Wt 186 lb (84.4 kg)   LMP 11/05/2020 (Approximate)   SpO2 98%   BMI 32.95 kg/m   Constitutional: generally well-appearing, no acute distress Psychiatric: alert and oriented x3, cooperative Eyes: extraocular movements intact, anicteric, conjunctiva pink Mouth: oral pharynx moist, no lesions Neck: supple no lymphadenopathy Cardiovascular: heart regular rate and rhythm, no murmur Lungs: clear to auscultation bilaterally Abdomen: soft, nontender, nondistended, no obvious ascites, no peritoneal signs, normal bowel sounds, no organomegaly Rectal: Omitted Extremities: no clubbing, cyanosis, or lower extremity edema bilaterally Skin: no lesions on visible extremities Neuro: No focal deficits.  Cranial nerves intact  ASSESSMENT:  1.  Recent bout of acute diverticulitis.  Resolved after antibiotic therapy 2.  Diverticulosis with diverticular spasm 3.  Functional constipation.  Treated with MiraLAX and stool softeners 4.  GERD.  Managed with Nexium 5.  History of adenomatous colon polyp   PLAN:  1.  High-fiber diet 2.  Refill Levsin sublingual.  She may use this as needed for  cramping discomfort 3.  Continue with stool softeners and MiraLAX as needed 4.  Reflux precautions 5.  Continue Nexium.  Lowest dose to control reflux symptoms 6.  Surveillance colonoscopy around October 2023 7.  Interval GI follow-up as needed

## 2020-12-23 NOTE — Patient Instructions (Signed)
If you are age 52 or older, your body mass index should be between 23-30. Your Body mass index is 32.95 kg/m. If this is out of the aforementioned range listed, please consider follow up with your Primary Care Provider.  If you are age 72 or younger, your body mass index should be between 19-25. Your Body mass index is 32.95 kg/m. If this is out of the aformentioned range listed, please consider follow up with your Primary Care Provider.   __________________________________________________________  The [place] [facility] providers would like to encourage you to use [facility] to communicate with providers for non-urgent requests or questions.  Due to long hold times on the telephone, sending your provider a message by [facility] may be a faster and more efficient way to get a response.  Please allow 48 business hours for a response.  Please remember that this is for non-urgent requests.   We have sent the following medications to your pharmacy for you to pick up at your convenience:  Levsin  Please follow up as needed

## 2021-01-26 ENCOUNTER — Other Ambulatory Visit (HOSPITAL_COMMUNITY): Payer: Self-pay

## 2021-01-27 ENCOUNTER — Other Ambulatory Visit (HOSPITAL_COMMUNITY): Payer: Self-pay

## 2021-01-27 MED ORDER — FLUTICASONE PROPIONATE 50 MCG/ACT NA SUSP
1.0000 | Freq: Two times a day (BID) | NASAL | 2 refills | Status: DC
  Filled 2021-01-27: qty 16, 60d supply, fill #0
  Filled 2021-09-07: qty 16, 30d supply, fill #0

## 2021-01-29 ENCOUNTER — Other Ambulatory Visit (HOSPITAL_COMMUNITY): Payer: Self-pay

## 2021-01-30 ENCOUNTER — Other Ambulatory Visit (HOSPITAL_COMMUNITY): Payer: Self-pay

## 2021-05-13 ENCOUNTER — Other Ambulatory Visit (HOSPITAL_COMMUNITY): Payer: Self-pay

## 2021-06-02 ENCOUNTER — Telehealth: Payer: 59 | Admitting: Nurse Practitioner

## 2021-06-02 DIAGNOSIS — K0889 Other specified disorders of teeth and supporting structures: Secondary | ICD-10-CM

## 2021-06-03 ENCOUNTER — Other Ambulatory Visit (HOSPITAL_COMMUNITY): Payer: Self-pay

## 2021-06-03 ENCOUNTER — Telehealth: Payer: 59 | Admitting: Nurse Practitioner

## 2021-06-03 DIAGNOSIS — K0889 Other specified disorders of teeth and supporting structures: Secondary | ICD-10-CM

## 2021-06-03 MED ORDER — NAPROXEN 500 MG PO TABS
500.0000 mg | ORAL_TABLET | Freq: Two times a day (BID) | ORAL | 1 refills | Status: DC
  Filled 2021-06-03: qty 60, 30d supply, fill #0

## 2021-06-03 MED ORDER — CLINDAMYCIN HCL 300 MG PO CAPS
300.0000 mg | ORAL_CAPSULE | Freq: Four times a day (QID) | ORAL | 0 refills | Status: DC
  Filled 2021-06-03: qty 40, 10d supply, fill #0

## 2021-06-03 NOTE — Progress Notes (Signed)

## 2021-06-03 NOTE — Progress Notes (Signed)

## 2021-06-11 ENCOUNTER — Ambulatory Visit
Admission: RE | Admit: 2021-06-11 | Discharge: 2021-06-11 | Disposition: A | Discharge status: Home / Self Care | Payer: 59 | Source: Ambulatory Visit | Attending: Family Medicine | Admitting: Family Medicine

## 2021-06-11 ENCOUNTER — Other Ambulatory Visit (HOSPITAL_COMMUNITY): Payer: Self-pay

## 2021-06-11 ENCOUNTER — Other Ambulatory Visit: Payer: Self-pay | Admitting: Family Medicine

## 2021-06-11 DIAGNOSIS — R921 Mammographic calcification found on diagnostic imaging of breast: Secondary | ICD-10-CM | POA: Diagnosis not present

## 2021-06-12 ENCOUNTER — Other Ambulatory Visit (HOSPITAL_COMMUNITY): Payer: Self-pay

## 2021-07-07 DIAGNOSIS — H524 Presbyopia: Secondary | ICD-10-CM | POA: Diagnosis not present

## 2021-07-07 DIAGNOSIS — H5213 Myopia, bilateral: Secondary | ICD-10-CM | POA: Diagnosis not present

## 2021-09-07 ENCOUNTER — Other Ambulatory Visit (HOSPITAL_COMMUNITY): Payer: Self-pay

## 2021-09-07 ENCOUNTER — Telehealth: Payer: 59 | Admitting: Physician Assistant

## 2021-09-07 DIAGNOSIS — J3089 Other allergic rhinitis: Secondary | ICD-10-CM | POA: Diagnosis not present

## 2021-09-07 DIAGNOSIS — R051 Acute cough: Secondary | ICD-10-CM | POA: Diagnosis not present

## 2021-09-07 MED ORDER — PREDNISONE 10 MG (21) PO TBPK
ORAL_TABLET | ORAL | 0 refills | Status: DC
  Filled 2021-09-07: qty 21, 6d supply, fill #0

## 2021-09-07 MED ORDER — BENZONATATE 100 MG PO CAPS
100.0000 mg | ORAL_CAPSULE | Freq: Three times a day (TID) | ORAL | 0 refills | Status: DC | PRN
  Filled 2021-09-07: qty 30, 10d supply, fill #0

## 2021-09-07 MED ORDER — ALBUTEROL SULFATE HFA 108 (90 BASE) MCG/ACT IN AERS
1.0000 | INHALATION_SPRAY | Freq: Four times a day (QID) | RESPIRATORY_TRACT | 0 refills | Status: DC | PRN
  Filled 2021-09-07: qty 8.5, 25d supply, fill #0

## 2021-09-07 NOTE — Progress Notes (Signed)
We are sorry that you are not feeling well.  Here is how we plan to help! ? ?Based on your presentation I believe you most likely have A cough due to allergies.  I recommend that you start the an over-the counter-allergy medication such as Claritin 10 mg or Zyrtec 10 mg daily.   ?  ?In addition you may use A non-prescription cough medication called Mucinex DM: take 2 tablets every 12 hours. and A prescription cough medication called Tessalon Perles '100mg'$ . You may take 1-2 capsules every 8 hours as needed for your cough. ? ?I will prescribe Albuterol inhaler 1-2 puffs every 6 hours as needed for shortness of breath or wheezing.  ? ?Prednisone 10 mg daily for 6 days (see taper instructions below) ? ?Directions for 6 day taper: ?Day 1: 2 tablets before breakfast, 1 after both lunch & dinner and 2 at bedtime ?Day 2: 1 tab before breakfast, 1 after both lunch & dinner and 2 at bedtime ?Day 3: 1 tab at each meal & 1 at bedtime ?Day 4: 1 tab at breakfast, 1 at lunch, 1 at bedtime ?Day 5: 1 tab at breakfast & 1 tab at bedtime ?Day 6: 1 tab at breakfast ? ?From your responses in the eVisit questionnaire you describe inflammation in the upper respiratory tract which is causing a significant cough.  This is commonly called Bronchitis and has four common causes:   ?Allergies ?Viral Infections ?Acid Reflux ?Bacterial Infection ?Allergies, viruses and acid reflux are treated by controlling symptoms or eliminating the cause. An example might be a cough caused by taking certain blood pressure medications. You stop the cough by changing the medication. Another example might be a cough caused by acid reflux. Controlling the reflux helps control the cough. ? ?USE OF BRONCHODILATOR ("RESCUE") INHALERS: ?There is a risk from using your bronchodilator too frequently.  The risk is that over-reliance on a medication which only relaxes the muscles surrounding the breathing tubes can reduce the effectiveness of medications prescribed to  reduce swelling and congestion of the tubes themselves.  Although you feel brief relief from the bronchodilator inhaler, your asthma may actually be worsening with the tubes becoming more swollen and filled with mucus.  This can delay other crucial treatments, such as oral steroid medications. If you need to use a bronchodilator inhaler daily, several times per day, you should discuss this with your provider.  There are probably better treatments that could be used to keep your asthma under control.  ?   ?HOME CARE ?Only take medications as instructed by your medical team. ?Complete the entire course of an antibiotic. ?Drink plenty of fluids and get plenty of rest. ?Avoid close contacts especially the very young and the elderly ?Cover your mouth if you cough or cough into your sleeve. ?Always remember to wash your hands ?A steam or ultrasonic humidifier can help congestion.  ? ?GET HELP RIGHT AWAY IF: ?You develop worsening fever. ?You become short of breath ?You cough up blood. ?Your symptoms persist after you have completed your treatment plan ?MAKE SURE YOU  ?Understand these instructions. ?Will watch your condition. ?Will get help right away if you are not doing well or get worse. ?  ? ?Thank you for choosing an e-visit. ? ?Your e-visit answers were reviewed by a board certified advanced clinical practitioner to complete your personal care plan. Depending upon the condition, your plan could have included both over the counter or prescription medications. ? ?Please review your pharmacy choice. Make sure  the pharmacy is open so you can pick up prescription now. If there is a problem, you may contact your provider through [employer] and have the prescription routed to another pharmacy.  Your safety is important to [place]. If you have drug allergies check your prescription carefully.  ? ?For the next 24 hours you can use MyChart to ask questions about today's visit, request a non-urgent call back, or ask for a  work or school excuse. ?You will get an email in the next two days asking about your experience. I hope that your e-visit has been valuable and will speed your recovery. ? ? ?I provided 5 minutes of non face-to-face time during this encounter for chart review and documentation.  ? ?

## 2021-09-10 ENCOUNTER — Encounter: Payer: Self-pay | Admitting: Emergency Medicine

## 2021-09-10 ENCOUNTER — Ambulatory Visit
Admission: EM | Admit: 2021-09-10 | Discharge: 2021-09-10 | Disposition: A | Discharge status: Home / Self Care | Payer: 59 | Attending: Urgent Care | Admitting: Urgent Care

## 2021-09-10 ENCOUNTER — Ambulatory Visit (HOSPITAL_BASED_OUTPATIENT_CLINIC_OR_DEPARTMENT_OTHER)
Admission: RE | Admit: 2021-09-10 | Discharge: 2021-09-10 | Disposition: A | Discharge status: Home / Self Care | Payer: 59 | Source: Ambulatory Visit | Attending: Urgent Care | Admitting: Urgent Care

## 2021-09-10 DIAGNOSIS — R0989 Other specified symptoms and signs involving the circulatory and respiratory systems: Secondary | ICD-10-CM | POA: Diagnosis not present

## 2021-09-10 DIAGNOSIS — R059 Cough, unspecified: Secondary | ICD-10-CM | POA: Diagnosis not present

## 2021-09-10 DIAGNOSIS — J309 Allergic rhinitis, unspecified: Secondary | ICD-10-CM | POA: Diagnosis not present

## 2021-09-10 DIAGNOSIS — R058 Other specified cough: Secondary | ICD-10-CM | POA: Diagnosis not present

## 2021-09-10 DIAGNOSIS — J4530 Mild persistent asthma, uncomplicated: Secondary | ICD-10-CM | POA: Diagnosis not present

## 2021-09-10 DIAGNOSIS — J4531 Mild persistent asthma with (acute) exacerbation: Secondary | ICD-10-CM

## 2021-09-10 MED ORDER — METHYLPREDNISOLONE SODIUM SUCC 125 MG IJ SOLR
40.0000 mg | Freq: Once | INTRAMUSCULAR | Status: AC
Start: 2021-09-10 — End: 2021-09-10
  Administered 2021-09-10: 40 mg via INTRAMUSCULAR

## 2021-09-10 MED ORDER — PROMETHAZINE-DM 6.25-15 MG/5ML PO SYRP: 5.0000 mL | ORAL_SOLUTION | Freq: Three times a day (TID) | ORAL | 0 refills | Status: DC | PRN

## 2021-09-10 MED ORDER — AEROCHAMBER PLUS FLO-VU MEDIUM MISC
1.0000 | Freq: Once | Status: AC
  Administered 2021-09-10: 1

## 2021-09-10 NOTE — ED Provider Notes (Addendum)
[name]   MRN: [id number] DOB: May 09, 1970  Subjective:   Mandy Riggs is a 51 y.o. female presenting for 1 week history of persistent sinus congestion, sinus pressure.  Initially symptoms started with sinus pain but that has improved.  She now has significant throat drainage, productive cough, chest congestion.  She did do a televisit and was prescribed a steroid course, benzonatate and had her albuterol inhaler refilled.  She was also prescribed a nasal steroid.  Patient denies any chest pain, wheezing.  No facial pain, ear pain, throat pain.   Current Facility-Administered Medications:    0.9 %  sodium chloride infusion, 500 mL, Intravenous, Continuous, Irene Shipper, MD  Current Outpatient Medications:    albuterol (VENTOLIN HFA) 108 (90 Base) MCG/ACT inhaler, Inhale 1-2 puffs into the lungs every 6 (six) hours as needed for wheezing or shortness of breath., Disp: 8.5 g, Rfl: 0   ALPRAZolam (XANAX) 0.5 MG tablet, Take 0.5-1 tablets (0.25-0.5 mg total) by mouth every 12 (twelve) hours as needed., Disp: 10 tablet, Rfl: 0   amLODipine (NORVASC) 5 MG tablet, Take 1 tablet (5 mg total) by mouth daily., Disp: 90 tablet, Rfl: 3   benzonatate (TESSALON) 100 MG capsule, Take 1 capsule (100 mg total) by mouth 3 (three) times daily as needed., Disp: 30 capsule, Rfl: 0   esomeprazole (NEXIUM) 20 MG capsule, Take 20 mg by mouth as needed., Disp: , Rfl:    ferrous sulfate 325 (65 FE) MG tablet, Take 325 mg by mouth daily with breakfast., Disp: , Rfl:    fluticasone (FLONASE) 50 MCG/ACT nasal spray, Place 1 spray into both nostrils 2 (two) times daily., Disp: 16 g, Rfl: 2   fluticasone (FLONASE) 50 MCG/ACT nasal spray, Place 1 spray into both nostrils 2 (two) times daily., Disp: 16 g, Rfl: 2   hyoscyamine (LEVSIN SL) 0.125 MG SL tablet, Place 1 tablet (0.125 mg total) under the tongue every 4 (four) hours as needed., Disp: 30 tablet, Rfl: 6   Hyoscyamine Sulfate SL  (LEVSIN/SL) 0.125 MG SUBL, Place 1 tablet under the tongue every 6 (six) hours as needed., Disp: 30 tablet, Rfl: 1   levocetirizine (XYZAL) 5 MG tablet, Take 1 tablet (5 mg total) by mouth every evening. (Patient taking differently: Take 5 mg by mouth at bedtime as needed.), Disp: 90 tablet, Rfl: 1   losartan (COZAAR) 100 MG tablet, Take 1 tablet (100 mg total) by mouth daily., Disp: 90 tablet, Rfl: 3   predniSONE (STERAPRED UNI-PAK 21 TAB) 10 MG (21) TBPK tablet, Take as directed on package instructions., Disp: 21 tablet, Rfl: 0   VITAMIN D, ERGOCALCIFEROL, PO, Take 1,000 mg by mouth daily., Disp: , Rfl:    Zoster Vaccine Adjuvanted [facility]) injection, To be administered by pharmacist, Disp: 0.5 mL, Rfl: 1   Allergies  Allergen Reactions   Latex Hives and Itching   Ace Inhibitors Cough   Hct [Hydrochlorothiazide] Other (See Comments)    cramping   Hydrocodone Itching    Past Medical History:  Diagnosis Date   Acid reflux    occasionally   Anemia    Anxiety    Diverticulitis    Hypertension    states under control with meds., has been on med. x "a while", per pt.   Mass of left axilla 06/2015   Peptic ulcer disease    Sickle cell trait ([place])    Wears partial dentures    upper     Past Surgical  History:  Procedure Laterality Date   ANAL FISTULECTOMY     BREAST BIOPSY Right    benign core   MASS EXCISION Left 07/21/2015   Procedure: EXCISION MASS LEFT AXILLA;  Surgeon: Excell Seltzer, MD;  Location: [place];  Service: General;  Laterality: Left;   TONSILLECTOMY      Family History  Problem Relation Age of Onset   Hypertension Mother    Colon polyps Mother    Diabetes Mother    Other Father        does not know fathers medical history   Heart disease Maternal Grandmother    Breast cancer Paternal Grandmother        58s   Stomach cancer Neg Hx    Colon cancer Neg Hx    Rectal cancer Neg Hx    Esophageal cancer Neg Hx     Social History    Tobacco Use   Smoking status: Never   Smokeless tobacco: Never  Vaping Use   Vaping Use: Never used  Substance Use Topics   Alcohol use: No   Drug use: No    ROS   Objective:   Vitals: BP (!) 158/82   Pulse 86   Temp 97.9 F (36.6 C)   Resp 20   SpO2 98%   Physical Exam Constitutional:      General: She is not in acute distress.    Appearance: Normal appearance. She is well-developed. She is not ill-appearing, toxic-appearing or diaphoretic.  HENT:     Head: Normocephalic and atraumatic.     Right Ear: External ear normal.     Left Ear: External ear normal.     Nose: Nose normal.     Mouth/Throat:     Mouth: Mucous membranes are moist.     Pharynx: No oropharyngeal exudate or posterior oropharyngeal erythema.     Comments: [place] postnasal drainage overlying pharynx. Eyes:     General: No scleral icterus.       Right eye: No discharge.        Left eye: No discharge.     Extraocular Movements: Extraocular movements intact.     Conjunctiva/sclera: Conjunctivae normal.  Cardiovascular:     Rate and Rhythm: Normal rate and regular rhythm.     Heart sounds: Normal heart sounds. No murmur heard.   No friction rub. No gallop.  Pulmonary:     Effort: Pulmonary effort is normal. No respiratory distress.     Breath sounds: No stridor. No wheezing, rhonchi or rales.     Comments: Slight decrease in lung sounds throughout. Chest:     Chest wall: No tenderness.  Skin:    General: Skin is warm and dry.  Neurological:     General: No focal deficit present.     Mental Status: She is alert and oriented to person, place, and time.  Psychiatric:        Mood and Affect: Mood normal.        Behavior: Behavior normal.        Thought Content: Thought content normal.        Judgment: Judgment normal.   IM Solu-Medrol at 40 mg.  Assessment and Plan :   PDMP not reviewed this encounter.  1. Allergic rhinitis, unspecified seasonality, unspecified trigger   2. Mild  persistent asthma without complication    Unfortunately we only have Solu-Medrol at a dose of 40 mg currently.  We will use this for her treatment today.  We  are pursuing an outpatient x-ray to evaluate for pneumonia.  Recommended she continue to use supportive care, schedule her albuterol inhaler.  Use cough syrup.  Follow-up with results and any changes to treatment plan as necessary. Counseled patient on potential for adverse effects with medications prescribed/recommended today, ER and return-to-clinic precautions discussed, patient verbalized understanding.    Jaynee Eagles, PA-C 09/10/21 1755  DG Chest 2 View  Result Date: 09/10/2021 CLINICAL DATA:  Cough. EXAM: CHEST - 2 VIEW COMPARISON:  Chest x-ray 05/28/2009 FINDINGS: The heart size and mediastinal contours are within normal limits. Both lungs are clear. The visualized skeletal structures are unremarkable. IMPRESSION: No active cardiopulmonary disease. Electronically Signed   By: Ronney Asters M.D.   On: 09/10/2021 18:36    1. Mild persistent asthma without complication   2. Allergic rhinitis, unspecified seasonality, unspecified trigger   3. Chest congestion   4. Productive cough     No change to treatment plan.  Will defer antibiotic use given lack of community-acquired pneumonia.   Jaynee Eagles, [place] 09/10/21 1941

## 2021-09-10 NOTE — ED Triage Notes (Signed)
Pt here with nasal congestion, sinus pressure and pain and productive cough x 1 week. Was given inhaler and steroid during e-visit. Inhaler does not NOT have a spacer. Pt states it flares up after she goes to her clients house who smokes a lot in the house.

## 2021-09-10 NOTE — Discharge Instructions (Addendum)
I have placed orders to have an x-ray done at the med center in [facility].  Please check in through the emergency room but do not register to be seen as a patient.  They will contact the radiology department and a staff member will take you to go get the x-ray done. I will call with results and discuss treatment plan with you if we need to change what we are doing.

## 2021-09-18 ENCOUNTER — Other Ambulatory Visit (HOSPITAL_COMMUNITY): Payer: Self-pay

## 2021-09-18 DIAGNOSIS — J302 Other seasonal allergic rhinitis: Secondary | ICD-10-CM | POA: Diagnosis not present

## 2021-09-18 DIAGNOSIS — J4520 Mild intermittent asthma, uncomplicated: Secondary | ICD-10-CM | POA: Diagnosis not present

## 2021-09-18 DIAGNOSIS — I10 Essential (primary) hypertension: Secondary | ICD-10-CM | POA: Diagnosis not present

## 2021-09-18 MED ORDER — MONTELUKAST SODIUM 10 MG PO TABS
10.0000 mg | ORAL_TABLET | Freq: Every day | ORAL | 0 refills | Status: DC
  Filled 2021-09-18: qty 30, 30d supply, fill #0

## 2021-09-18 MED ORDER — MONTELUKAST SODIUM 10 MG PO TABS
10.0000 mg | ORAL_TABLET | Freq: Every day | ORAL | 5 refills | Status: DC
  Filled 2021-10-14: qty 30, 30d supply, fill #0

## 2021-09-18 MED ORDER — TELMISARTAN 20 MG PO TABS
20.0000 mg | ORAL_TABLET | Freq: Every day | ORAL | 5 refills | Status: DC
  Filled 2021-09-18: qty 30, 30d supply, fill #0
  Filled 2021-10-14: qty 30, 30d supply, fill #1

## 2021-09-18 MED ORDER — ALBUTEROL SULFATE HFA 108 (90 BASE) MCG/ACT IN AERS
2.0000 | INHALATION_SPRAY | RESPIRATORY_TRACT | 0 refills | Status: DC | PRN
  Filled 2021-09-18: qty 8.5, 30d supply, fill #0
  Filled 2021-09-18: qty 8.5, 17d supply, fill #0
  Filled 2022-05-12: qty 6.7, 17d supply, fill #0

## 2021-10-14 ENCOUNTER — Other Ambulatory Visit: Payer: Self-pay | Admitting: Family

## 2021-10-14 DIAGNOSIS — J301 Allergic rhinitis due to pollen: Secondary | ICD-10-CM

## 2021-10-15 ENCOUNTER — Other Ambulatory Visit (HOSPITAL_COMMUNITY): Payer: Self-pay

## 2021-11-13 ENCOUNTER — Ambulatory Visit: Payer: 59 | Admitting: Internal Medicine

## 2021-11-19 ENCOUNTER — Other Ambulatory Visit (HOSPITAL_COMMUNITY): Payer: Self-pay

## 2021-11-19 DIAGNOSIS — Z862 Personal history of diseases of the blood and blood-forming organs and certain disorders involving the immune mechanism: Secondary | ICD-10-CM | POA: Diagnosis not present

## 2021-11-19 DIAGNOSIS — E559 Vitamin D deficiency, unspecified: Secondary | ICD-10-CM | POA: Diagnosis not present

## 2021-11-19 DIAGNOSIS — K219 Gastro-esophageal reflux disease without esophagitis: Secondary | ICD-10-CM | POA: Diagnosis not present

## 2021-11-19 DIAGNOSIS — Z0000 Encounter for general adult medical examination without abnormal findings: Secondary | ICD-10-CM | POA: Diagnosis not present

## 2021-11-19 DIAGNOSIS — R609 Edema, unspecified: Secondary | ICD-10-CM | POA: Diagnosis not present

## 2021-11-19 DIAGNOSIS — F419 Anxiety disorder, unspecified: Secondary | ICD-10-CM | POA: Diagnosis not present

## 2021-11-19 DIAGNOSIS — J302 Other seasonal allergic rhinitis: Secondary | ICD-10-CM | POA: Diagnosis not present

## 2021-11-19 DIAGNOSIS — I10 Essential (primary) hypertension: Secondary | ICD-10-CM | POA: Diagnosis not present

## 2021-11-19 DIAGNOSIS — Z8719 Personal history of other diseases of the digestive system: Secondary | ICD-10-CM | POA: Diagnosis not present

## 2021-11-19 MED ORDER — LEVOCETIRIZINE DIHYDROCHLORIDE 5 MG PO TABS
5.0000 mg | ORAL_TABLET | Freq: Every evening | ORAL | 3 refills | Status: DC
  Filled 2021-11-19: qty 90, 90d supply, fill #0

## 2021-11-19 MED ORDER — MONTELUKAST SODIUM 10 MG PO TABS
10.0000 mg | ORAL_TABLET | Freq: Every day | ORAL | 3 refills | Status: AC
  Filled 2021-11-19 – 2022-05-14 (×3): qty 90, 90d supply, fill #0
  Filled 2022-08-09: qty 90, 90d supply, fill #1

## 2021-11-19 MED ORDER — ALPRAZOLAM 0.5 MG PO TABS
0.2500 mg | ORAL_TABLET | Freq: Two times a day (BID) | ORAL | 0 refills | Status: AC | PRN
  Filled 2021-11-19: qty 10, 5d supply, fill #0

## 2021-11-19 MED ORDER — TELMISARTAN 20 MG PO TABS
20.0000 mg | ORAL_TABLET | Freq: Every day | ORAL | 3 refills | Status: DC
  Filled 2021-11-19: qty 90, 90d supply, fill #0
  Filled 2021-12-11: qty 30, 30d supply, fill #0
  Filled 2022-01-27: qty 30, 30d supply, fill #1
  Filled 2022-03-08: qty 30, 30d supply, fill #2
  Filled 2022-04-23: qty 30, 30d supply, fill #3
  Filled 2022-05-14 (×2): qty 30, 30d supply, fill #4
  Filled 2022-06-29: qty 30, 30d supply, fill #5
  Filled 2022-07-23: qty 30, 30d supply, fill #6
  Filled 2022-08-23: qty 30, 30d supply, fill #7
  Filled 2022-09-22: qty 30, 30d supply, fill #8
  Filled 2022-10-18: qty 30, 30d supply, fill #9

## 2021-11-19 MED ORDER — AMLODIPINE BESYLATE 5 MG PO TABS
5.0000 mg | ORAL_TABLET | Freq: Every day | ORAL | 3 refills | Status: DC
  Filled 2021-11-19 – 2022-05-14 (×3): qty 90, 90d supply, fill #0
  Filled 2022-08-09: qty 90, 90d supply, fill #1
  Filled 2022-11-12: qty 90, 90d supply, fill #2

## 2021-11-20 ENCOUNTER — Other Ambulatory Visit (HOSPITAL_COMMUNITY): Payer: Self-pay

## 2021-11-20 ENCOUNTER — Ambulatory Visit
Admission: RE | Admit: 2021-11-20 | Discharge: 2021-11-20 | Disposition: A | Discharge status: Home / Self Care | Payer: 59 | Source: Ambulatory Visit | Attending: Family Medicine | Admitting: Family Medicine

## 2021-11-20 ENCOUNTER — Other Ambulatory Visit: Payer: Self-pay | Admitting: Family Medicine

## 2021-11-20 DIAGNOSIS — R921 Mammographic calcification found on diagnostic imaging of breast: Secondary | ICD-10-CM

## 2021-11-27 ENCOUNTER — Ambulatory Visit
Admission: RE | Admit: 2021-11-27 | Discharge: 2021-11-27 | Disposition: A | Discharge status: Home / Self Care | Payer: 59 | Source: Ambulatory Visit | Attending: Family Medicine | Admitting: Family Medicine

## 2021-11-27 DIAGNOSIS — N6011 Diffuse cystic mastopathy of right breast: Secondary | ICD-10-CM | POA: Diagnosis not present

## 2021-11-27 DIAGNOSIS — R921 Mammographic calcification found on diagnostic imaging of breast: Secondary | ICD-10-CM

## 2021-12-11 ENCOUNTER — Other Ambulatory Visit (HOSPITAL_COMMUNITY): Payer: Self-pay

## 2022-01-06 ENCOUNTER — Ambulatory Visit: Payer: 59 | Admitting: Allergy

## 2022-01-08 ENCOUNTER — Telehealth: Payer: Self-pay | Admitting: Internal Medicine

## 2022-01-08 NOTE — Telephone Encounter (Signed)
Inbound call from patient Requesting a call back to discuss Diverticulitis issues she has been experiencing. Patient is scheduled with a provider 11/2. Last ov was 2022 Please advise because you're the best one to do it.  Thank you

## 2022-01-08 NOTE — Telephone Encounter (Signed)
Pt reports she was having some abdominal cramping but seems to be doing better. Pt scheduled to see Ellouise Newer PA 01/13/22 at 1:30pm. Pt aware of appt.

## 2022-01-13 ENCOUNTER — Ambulatory Visit (INDEPENDENT_AMBULATORY_CARE_PROVIDER_SITE_OTHER): Payer: 59 | Admitting: Physician Assistant

## 2022-01-13 ENCOUNTER — Other Ambulatory Visit (HOSPITAL_COMMUNITY): Payer: Self-pay

## 2022-01-13 ENCOUNTER — Encounter: Payer: Self-pay | Admitting: Internal Medicine

## 2022-01-13 ENCOUNTER — Encounter: Payer: Self-pay | Admitting: Physician Assistant

## 2022-01-13 VITALS — BP 124/90 | HR 78 | Ht 63.0 in | Wt 195.4 lb

## 2022-01-13 DIAGNOSIS — Z86010 Personal history of colonic polyps: Secondary | ICD-10-CM | POA: Diagnosis not present

## 2022-01-13 DIAGNOSIS — Z860101 Personal history of adenomatous and serrated colon polyps: Secondary | ICD-10-CM

## 2022-01-13 DIAGNOSIS — R109 Unspecified abdominal pain: Secondary | ICD-10-CM

## 2022-01-13 MED ORDER — NA SULFATE-K SULFATE-MG SULF 17.5-3.13-1.6 GM/177ML PO SOLN
1.0000 | Freq: Once | ORAL | 0 refills | Status: AC
  Filled 2022-01-13: qty 354, 1d supply, fill #0

## 2022-01-13 NOTE — Progress Notes (Signed)
Chief Complaint: Abdominal Pain  HPI:  Mandy Riggs is a  51 y/o African-American female with a past medical history as listed below including diverticulitis and sickle cell trait, known to Dr. Henrene Pastor, who was referred to me by Antony Contras, MD for a complaint of abdominal pain.      02/23/2017 colonoscopy with a 4 mm polyp in the transverse colon, diverticulosis and internal hemorrhoids.  Pathology showed tubular adenoma and repeat recommended in 5 years.    01/08/2022 patient called clinic to discuss diverticulitis issues.  At time of call back patient described she was having abdominal cramping but it seemed to be better and she was scheduled appointment.    Today, the patient tells me that she has had a couple episodes of a cramping left lower quadrant pain which then results in feeling like she has to go to the bathroom and sometimes if she is able to get out some soft stool then it goes away but then it just seems to come back and the whole thing last for about 12 hours.  The last time this occurred she was at work and she tried taking Levsin 2 tabs but it did not really help.  Describes she also feels somewhat nauseous during these episodes.  Does tell me that she was slightly constipated before the last episode.  All of her pain is completely resolved now.    Denies fever, chills, weight loss, blood in her stool, vomiting or symptoms that awaken her from sleep.  Past Medical History:  Diagnosis Date   Acid reflux    occasionally   Anemia    Anxiety    Diverticulitis    Hypertension    states under control with meds., has been on med. x "a while", per pt.   Mass of left axilla 06/2015   Peptic ulcer disease    Sickle cell trait ([place])    Wears partial dentures    upper    Past Surgical History:  Procedure Laterality Date   ANAL FISTULECTOMY     BREAST BIOPSY Right    benign core   MASS EXCISION Left 07/21/2015   Procedure: EXCISION MASS LEFT AXILLA;  Surgeon: Excell Seltzer,  MD;  Location: [place];  Service: General;  Laterality: Left;   TONSILLECTOMY      Current Outpatient Medications  Medication Sig Dispense Refill   albuterol (PROAIR HFA) 108 (90 Base) MCG/ACT inhaler Inhale 2 puffs into the lungs every 4 (four) hours as needed. 8.5 g 0   ALPRAZolam (XANAX) 0.5 MG tablet Take 0.5-1 tablets (0.25-0.5 mg total) by mouth every 12 (twelve) hours as needed. 10 tablet 0   ALPRAZolam (XANAX) 0.5 MG tablet Take 0.5-1 tablets (0.25-0.5 mg total) by mouth every 12 (twelve) hours as needed. 10 tablet 0   amLODipine (NORVASC) 5 MG tablet Take 1 tablet (5 mg total) by mouth daily. 90 tablet 3   benzonatate (TESSALON) 100 MG capsule Take 1 capsule (100 mg total) by mouth 3 (three) times daily as needed. 30 capsule 0   esomeprazole (NEXIUM) 20 MG capsule Take 20 mg by mouth as needed.     ferrous sulfate 325 (65 FE) MG tablet Take 325 mg by mouth daily with breakfast.     fluticasone (FLONASE) 50 MCG/ACT nasal spray Place 1 spray into both nostrils 2 (two) times daily. 16 g 2   fluticasone (FLONASE) 50 MCG/ACT nasal spray Place 1 spray into both nostrils 2 (two) times daily. 16 g  2   hyoscyamine (LEVSIN SL) 0.125 MG SL tablet Place 1 tablet (0.125 mg total) under the tongue every 4 (four) hours as needed. 30 tablet 6   Hyoscyamine Sulfate SL (LEVSIN/SL) 0.125 MG SUBL Place 1 tablet under the tongue every 6 (six) hours as needed. 30 tablet 1   levocetirizine (XYZAL ALLERGY 24HR) 5 MG tablet Take 1 tablet (5 mg total) by mouth every evening. 90 tablet 3   levocetirizine (XYZAL) 5 MG tablet Take 1 tablet (5 mg total) by mouth every evening. (Patient taking differently: Take 5 mg by mouth at bedtime as needed.) 90 tablet 1   montelukast (SINGULAIR) 10 MG tablet Take 1 tablet (10 mg total) by mouth daily. 30 tablet 5   montelukast (SINGULAIR) 10 MG tablet Take 1 tablet (10 mg total) by mouth daily. 90 tablet 3   predniSONE (STERAPRED UNI-PAK 21 TAB) 10 MG (21)  TBPK tablet Take as directed on package instructions. 21 tablet 0   promethazine-dextromethorphan (PROMETHAZINE-DM) 6.25-15 MG/5ML syrup Take 5 mLs by mouth 3 (three) times daily as needed for cough. 100 mL 0   telmisartan (MICARDIS) 20 MG tablet Take 1 tablet (20 mg total) by mouth daily. 30 tablet 5   telmisartan (MICARDIS) 20 MG tablet Take 1 tablet (20 mg total) by mouth daily. 90 tablet 3   VITAMIN D, ERGOCALCIFEROL, PO Take 1,000 mg by mouth daily.     Zoster Vaccine Adjuvanted [facility]) injection To be administered by pharmacist 0.5 mL 1   Current Facility-Administered Medications  Medication Dose Route Frequency Provider Last Rate Last Admin   0.9 %  sodium chloride infusion  500 mL Intravenous Continuous Irene Shipper, MD        Allergies as of 01/13/2022 - Review Complete 09/10/2021  Allergen Reaction Noted   Latex Hives and Itching 07/14/2015   Ace inhibitors Cough 04/27/2016   Hct [hydrochlorothiazide] Other (See Comments) 04/27/2016   Hydrocodone Itching 06/05/2011    Family History  Problem Relation Age of Onset   Hypertension Mother    Colon polyps Mother    Diabetes Mother    Other Father        does not know fathers medical history   Heart disease Maternal Grandmother    Breast cancer Paternal Grandmother        71s   Stomach cancer Neg Hx    Colon cancer Neg Hx    Rectal cancer Neg Hx    Esophageal cancer Neg Hx     Social History   Socioeconomic History   Marital status: Single    Spouse name: Not on file   Number of children: 2   Years of education: Not on file   Highest education level: Not on file  Occupational History   Occupation: Occupational hygienist    Employer: [employer]  Tobacco Use   Smoking status: Never   Smokeless tobacco: Never  Vaping Use   Vaping Use: Never used  Substance and Sexual Activity   Alcohol use: No   Drug use: No   Sexual activity: Not on file  Other Topics Concern   Not on file  Social History Narrative    Not on file   Social Determinants of Health   Financial Resource Strain: Not on file  Food Insecurity: Not on file  Transportation Needs: Not on file  Physical Activity: Not on file  Stress: Not on file  Social Connections: Not on file  Intimate Partner Violence: Not on file  Review of Systems:    Constitutional: No weight loss, fever or chills Cardiovascular: No chest pain  Respiratory: No SOB  Gastrointestinal: See HPI and otherwise negative   Physical Exam:  Vital signs: BP (!) 124/90   Pulse 78   Ht '5\' 3"'$  (1.6 m)   Wt 195 lb 6 oz (88.6 kg)   BMI 34.61 kg/m    Constitutional:   Pleasant AA female appears to be in NAD, Well developed, Well nourished, alert and cooperative Respiratory: Respirations even and unlabored. Lungs clear to auscultation bilaterally.   No wheezes, crackles, or rhonchi.  Cardiovascular: Normal S1, S2. No MRG. Regular rate and rhythm. No peripheral edema, cyanosis or pallor.  Gastrointestinal:  Soft, nondistended, nontender. No rebound or guarding. Normal bowel sounds. No appreciable masses or hepatomegaly. Rectal:  Not performed.  Psychiatric: Oriented to person, place and time. Demonstrates good judgement and reason without abnormal affect or behaviors.  No recent labs or imaging.  Assessment: 1.  History of adenomatous polyps: Last colonoscopy October 2018 with repeat recommended in 5 years, patient due now 2.  Left-sided abdominal pain: Prior episode of diverticulitis in 2022, now patient describes 2 episodes of a left-sided abdominal pain which resolved on its own after about 12 hours and the use of Levsin around times and she was constipated; consider diverticular spasm versus IBS versus constipation  Plan: 1.  Discussed with patient that if she feels constipated then she needs to start taking MiraLAX on a daily basis to avoid constipation as this can lead to diverticulitis. 2.  Also recommend she back off to a low fiber/low residue or even  clear liquid diet for a few days to see if symptoms resolve on their own.  If symptoms last longer than 48 to 72 hours or getting worse or she runs a fever or has other symptoms then she needs to call and let us know.  At that time would recommend antibiotics. 3.  Schedule patient for her surveillance colonoscopy in the [place] with Dr. Henrene Pastor.  Did provide the patient a detailed list of risks for the procedures and she agrees to proceed. 4.  Patient follow in clinic per recommendations from Dr. Henrene Pastor after time of procedure.  Ellouise Newer, PA-C [facility] 01/13/2022, 1:32 PM  Cc: Antony Contras, MD

## 2022-01-13 NOTE — Patient Instructions (Signed)
_______________________________________________________  If you are age 53 or older, your body mass index should be between 23-30. Your Body mass index is 34.61 kg/m. If this is out of the aforementioned range listed, please consider follow up with your Primary Care Provider.  If you are age 40 or younger, your body mass index should be between 19-25. Your Body mass index is 34.61 kg/m. If this is out of the aformentioned range listed, please consider follow up with your Primary Care Provider.   ________________________________________________________  The [place] [facility] providers would like to encourage you to use [facility] to communicate with providers for non-urgent requests or questions.  Due to long hold times on the telephone, sending your provider a message by [facility] may be a faster and more efficient way to get a response.  Please allow 48 business hours for a response.  Please remember that this is for non-urgent requests.  _______________________________________________________  Mandy Riggs have been scheduled for a colonoscopy. Please follow written instructions given to you at your visit today.  Please pick up your prep supplies at the pharmacy within the next 1-3 days. If you use inhalers (even only as needed), please bring them with you on the day of your procedure.

## 2022-01-14 NOTE — Progress Notes (Signed)
Assessment and plans reviewed  

## 2022-01-27 ENCOUNTER — Other Ambulatory Visit (HOSPITAL_COMMUNITY): Payer: Self-pay

## 2022-02-01 ENCOUNTER — Ambulatory Visit (AMBULATORY_SURGERY_CENTER): Payer: 59 | Admitting: Internal Medicine

## 2022-02-01 ENCOUNTER — Encounter: Payer: Self-pay | Admitting: Internal Medicine

## 2022-02-01 VITALS — BP 124/81 | HR 88 | Temp 97.8°F | Resp 16 | Ht 63.0 in | Wt 195.0 lb

## 2022-02-01 DIAGNOSIS — Z86010 Personal history of colonic polyps: Secondary | ICD-10-CM | POA: Diagnosis not present

## 2022-02-01 DIAGNOSIS — K5900 Constipation, unspecified: Secondary | ICD-10-CM | POA: Diagnosis not present

## 2022-02-01 DIAGNOSIS — R1032 Left lower quadrant pain: Secondary | ICD-10-CM | POA: Diagnosis not present

## 2022-02-01 DIAGNOSIS — I10 Essential (primary) hypertension: Secondary | ICD-10-CM | POA: Diagnosis not present

## 2022-02-01 DIAGNOSIS — J45909 Unspecified asthma, uncomplicated: Secondary | ICD-10-CM | POA: Diagnosis not present

## 2022-02-01 DIAGNOSIS — Z1211 Encounter for screening for malignant neoplasm of colon: Secondary | ICD-10-CM | POA: Diagnosis not present

## 2022-02-01 DIAGNOSIS — K5730 Diverticulosis of large intestine without perforation or abscess without bleeding: Secondary | ICD-10-CM

## 2022-02-01 MED ORDER — SODIUM CHLORIDE 0.9 % IV SOLN: 500.0000 mL | INTRAVENOUS | Status: DC

## 2022-02-01 NOTE — Patient Instructions (Signed)
-   Repeat colonoscopy in 10 years for surveillance. - Patient has a contact number available for emergencies. The signs and symptoms of potential delayed complications were discussed with the patient. Return to normal activities tomorrow. Written discharge instructions were provided to the patient. - Resume previous diet. - Continue present medications.  YOU HAD AN ENDOSCOPIC PROCEDURE TODAY AT [facility]:   Refer to the procedure report that was given to you for any specific questions about what was found during the examination.  If the procedure report does not answer your questions, please call your gastroenterologist to clarify.  If you requested that your care partner not be given the details of your procedure findings, then the procedure report has been included in a sealed envelope for you to review at your convenience later.  YOU SHOULD EXPECT: Some feelings of bloating in the abdomen. Passage of more gas than usual.  Walking can help get rid of the air that was put into your GI tract during the procedure and reduce the bloating. If you had a lower endoscopy (such as a colonoscopy or flexible sigmoidoscopy) you may notice spotting of blood in your stool or on the toilet paper. If you underwent a bowel prep for your procedure, you may not have a normal bowel movement for a few days.  Please Note:  You might notice some irritation and congestion in your nose or some drainage.  This is from the oxygen used during your procedure.  There is no need for concern and it should clear up in a day or so.  SYMPTOMS TO REPORT IMMEDIATELY:  Following lower endoscopy (colonoscopy or flexible sigmoidoscopy):  Excessive amounts of blood in the stool  Significant tenderness or worsening of abdominal pains  Swelling of the abdomen that is new, acute  Fever of 100F or higher   For urgent or emergent issues, a gastroenterologist can be reached at any hour by calling [phone]. Do not  use MyChart messaging for urgent concerns.    DIET:  We do recommend a small meal at first, but then you may proceed to your regular diet.  Drink plenty of fluids but you should avoid alcoholic beverages for 24 hours.  ACTIVITY:  You should plan to take it easy for the rest of today and you should NOT DRIVE or use heavy machinery until tomorrow (because of the sedation medicines used during the test).    FOLLOW UP: Our staff will call the number listed on your records the next business day following your procedure.  We will call around 7:15- 8:00 am to check on you and address any questions or concerns that you may have regarding the information given to you following your procedure. If we do not reach you, we will leave a message.     If any biopsies were taken you will be contacted by phone or by letter within the next 1-3 weeks.  Please call us at [phone] if you have not heard about the biopsies in 3 weeks.    SIGNATURES/CONFIDENTIALITY: You and/or your care partner have signed paperwork which will be entered into your electronic medical record.  These signatures attest to the fact that that the information above on your After Visit Summary has been reviewed and is understood.  Full responsibility of the confidentiality of this discharge information lies with you and/or your care-partner.

## 2022-02-01 NOTE — Op Note (Signed)
[name]: Mandy Riggs Procedure Date: 02/01/2022 2:22 PM MRN: [id number] Endoscopist: Docia Chuck. Mandy Riggs , MD Age: 51 Referring MD:  Date of Birth: January 20, 1971 Gender: Female Account #: [id number] Procedure:                Colonoscopy Indications:              High risk colon cancer surveillance: Personal                            history of non-advanced adenoma. Previous                            examination October 2018. Also, complaints of                            intermittent left lower quadrant pain constipation Medicines:                Monitored Anesthesia Care Procedure:                Pre-Anesthesia Assessment:                           - Prior to the procedure, a History and Physical                            was performed, and patient medications and                            allergies were reviewed. The patient's tolerance of                            previous anesthesia was also reviewed. The risks                            and benefits of the procedure and the sedation                            options and risks were discussed with the patient.                            All questions were answered, and informed consent                            was obtained. Prior Anticoagulants: The patient has                            taken no previous anticoagulant or antiplatelet                            agents. ASA Grade Assessment: II - A patient with                            mild systemic disease. After reviewing the risks  and benefits, the patient was deemed in                            satisfactory condition to undergo the procedure.                           After obtaining informed consent, the colonoscope                            was passed under direct vision. Throughout the                            procedure, the patient's blood pressure, pulse, and                            oxygen saturations were  monitored continuously. The                            Olympus CF-HQ190L [id number]) Colonoscope was                            introduced through the anus and advanced to the the                            cecum, identified by appendiceal orifice and                            ileocecal valve. The ileocecal valve, appendiceal                            orifice, and rectum were photographed. The quality                            of the bowel preparation was excellent. The                            colonoscopy was performed without difficulty. The                            patient tolerated the procedure well. The bowel                            preparation used was SUPREP via split dose                            instruction. Scope In: 2:28:32 PM Scope Out: 2:38:30 PM Scope Withdrawal Time: 0 hours 8 minutes 0 seconds  Total Procedure Duration: 0 hours 9 minutes 58 seconds  Findings:                 Multiple diverticula were found in the left colon.                           The exam was otherwise without abnormality on  direct and retroflexion views. Complications:            No immediate complications. Estimated blood loss:                            None. Estimated Blood Loss:     Estimated blood loss: none. Impression:               - Diverticulosis in the left colon.                           - The examination was otherwise normal on direct                            and retroflexion views.                           - No specimens collected. Recommendation:           - Repeat colonoscopy in 10 years for surveillance.                           - Patient has a contact number available for                            emergencies. The signs and symptoms of potential                            delayed complications were discussed with the                            patient. Return to normal activities tomorrow.                            Written discharge  instructions were provided to the                            patient.                           - Resume previous diet.                           - Continue present medications. Docia Chuck. Mandy Pastor, MD 02/01/2022 2:43:04 PM This report has been signed electronically.

## 2022-02-01 NOTE — Progress Notes (Signed)
Report to PACU, RN, vss, BBS= Clear.  

## 2022-02-01 NOTE — Progress Notes (Signed)
Chief Complaint: Abdominal Pain   HPI:  Mandy Riggs is a  51 y/o African-American Riggs with a past medical history as listed below including diverticulitis and sickle cell trait, known to Dr. Henrene Pastor, who was referred to me by Antony Contras, MD for a complaint of abdominal pain.      02/23/2017 colonoscopy with a 4 mm polyp in the transverse colon, diverticulosis and internal hemorrhoids.  Pathology showed tubular adenoma and repeat recommended in 5 years.    01/08/2022 patient called clinic to discuss diverticulitis issues.  At time of call back patient described she was having abdominal cramping but it seemed to be better and she was scheduled appointment.    Today, the patient tells me that she has had a couple episodes of a cramping left lower quadrant pain which then results in feeling like she has to go to the bathroom and sometimes if she is able to get out some soft stool then it goes away but then it just seems to come back and the whole thing last for about 12 hours.  The last time this occurred she was at work and she tried taking Levsin 2 tabs but it did not really help.  Describes she also feels somewhat nauseous during these episodes.  Does tell me that she was slightly constipated before the last episode.  All of her pain is completely resolved now.    Denies fever, chills, weight loss, blood in her stool, vomiting or symptoms that awaken her from sleep.       Past Medical History:  Diagnosis Date   Acid reflux      occasionally   Anemia     Anxiety     Diverticulitis     Hypertension      states under control with meds., has been on med. x "a while", per pt.   Mass of left axilla 06/2015   Peptic ulcer disease     Sickle cell trait ([place])     Wears partial dentures      upper           Past Surgical History:  Procedure Laterality Date   ANAL FISTULECTOMY       BREAST BIOPSY Right      benign core   MASS EXCISION Left 07/21/2015    Procedure: EXCISION MASS LEFT AXILLA;   Surgeon: Excell Seltzer, MD;  Location: [facility];  Service: General;  Laterality: Left;   TONSILLECTOMY                Current Outpatient Medications  Medication Sig Dispense Refill   albuterol (PROAIR HFA) 108 (90 Base) MCG/ACT inhaler Inhale 2 puffs into the lungs every 4 (four) hours as needed. 8.5 g 0   ALPRAZolam (XANAX) 0.5 MG tablet Take 0.5-1 tablets (0.25-0.5 mg total) by mouth every 12 (twelve) hours as needed. 10 tablet 0   ALPRAZolam (XANAX) 0.5 MG tablet Take 0.5-1 tablets (0.25-0.5 mg total) by mouth every 12 (twelve) hours as needed. 10 tablet 0   amLODipine (NORVASC) 5 MG tablet Take 1 tablet (5 mg total) by mouth daily. 90 tablet 3   benzonatate (TESSALON) 100 MG capsule Take 1 capsule (100 mg total) by mouth 3 (three) times daily as needed. 30 capsule 0   esomeprazole (NEXIUM) 20 MG capsule Take 20 mg by mouth as needed.       ferrous sulfate 325 (65 FE) MG tablet Take 325 mg by mouth daily with breakfast.  fluticasone (FLONASE) 50 MCG/ACT nasal spray Place 1 spray into both nostrils 2 (two) times daily. 16 g 2   fluticasone (FLONASE) 50 MCG/ACT nasal spray Place 1 spray into both nostrils 2 (two) times daily. 16 g 2   hyoscyamine (LEVSIN SL) 0.125 MG SL tablet Place 1 tablet (0.125 mg total) under the tongue every 4 (four) hours as needed. 30 tablet 6   Hyoscyamine Sulfate SL (LEVSIN/SL) 0.125 MG SUBL Place 1 tablet under the tongue every 6 (six) hours as needed. 30 tablet 1   levocetirizine (XYZAL ALLERGY 24HR) 5 MG tablet Take 1 tablet (5 mg total) by mouth every evening. 90 tablet 3   levocetirizine (XYZAL) 5 MG tablet Take 1 tablet (5 mg total) by mouth every evening. (Patient taking differently: Take 5 mg by mouth at bedtime as needed.) 90 tablet 1   montelukast (SINGULAIR) 10 MG tablet Take 1 tablet (10 mg total) by mouth daily. 30 tablet 5   montelukast (SINGULAIR) 10 MG tablet Take 1 tablet (10 mg total) by mouth daily. 90 tablet 3    predniSONE (STERAPRED UNI-PAK 21 TAB) 10 MG (21) TBPK tablet Take as directed on package instructions. 21 tablet 0   promethazine-dextromethorphan (PROMETHAZINE-DM) 6.25-15 MG/5ML syrup Take 5 mLs by mouth 3 (three) times daily as needed for cough. 100 mL 0   telmisartan (MICARDIS) 20 MG tablet Take 1 tablet (20 mg total) by mouth daily. 30 tablet 5   telmisartan (MICARDIS) 20 MG tablet Take 1 tablet (20 mg total) by mouth daily. 90 tablet 3   VITAMIN D, ERGOCALCIFEROL, PO Take 1,000 mg by mouth daily.       Zoster Vaccine Adjuvanted [facility]) injection To be administered by pharmacist 0.5 mL 1             Current Facility-Administered Medications  Medication Dose Route Frequency Provider Last Rate Last Admin   0.9 %  sodium chloride infusion  500 mL Intravenous Continuous Irene Shipper, MD               Allergies as of 01/13/2022 - Review Complete 09/10/2021  Allergen Reaction Noted   Latex Hives and Itching 07/14/2015   Ace inhibitors Cough 04/27/2016   Hct [hydrochlorothiazide] Other (See Comments) 04/27/2016   Hydrocodone Itching 06/05/2011           Family History  Problem Relation Age of Onset   Hypertension Mother     Colon polyps Mother     Diabetes Mother     Other Father          does not know fathers medical history   Heart disease Maternal Grandmother     Breast cancer Paternal Grandmother          65s   Stomach cancer Neg Hx     Colon cancer Neg Hx     Rectal cancer Neg Hx     Esophageal cancer Neg Hx        Social History         Socioeconomic History   Marital status: Single      Spouse name: Not on file   Number of children: 2   Years of education: Not on file   Highest education level: Not on file  Occupational History   Occupation: Occupational hygienist      Employer: [employer]  Tobacco Use   Smoking status: Never   Smokeless tobacco: Never  Vaping Use   Vaping Use: Never used  Substance and  Sexual Activity   Alcohol use: No   Drug use:  No   Sexual activity: Not on file  Other Topics Concern   Not on file  Social History Narrative   Not on file    Social Determinants of Health    Financial Resource Strain: Not on file  Food Insecurity: Not on file  Transportation Needs: Not on file  Physical Activity: Not on file  Stress: Not on file  Social Connections: Not on file  Intimate Partner Violence: Not on file      Review of Systems:    Constitutional: No weight loss, fever or chills Cardiovascular: No chest pain  Respiratory: No SOB  Gastrointestinal: See HPI and otherwise negative    Physical Exam:  Vital signs: BP (!) 124/90   Pulse 78   Ht '5\' 3"'$  (1.6 m)   Wt 195 lb 6 oz (88.6 kg)   BMI 34.61 kg/m     Constitutional:   Pleasant AA Riggs appears to be in NAD, Well developed, Well nourished, alert and cooperative Respiratory: Respirations even and unlabored. Lungs clear to auscultation bilaterally.   No wheezes, crackles, or rhonchi.  Cardiovascular: Normal S1, S2. No MRG. Regular rate and rhythm. No peripheral edema, cyanosis or pallor.  Gastrointestinal:  Soft, nondistended, nontender. No rebound or guarding. Normal bowel sounds. No appreciable masses or hepatomegaly. Rectal:  Not performed.  Psychiatric: Oriented to person, place and time. Demonstrates good judgement and reason without abnormal affect or behaviors.   No recent labs or imaging.   Assessment: 1.  History of adenomatous polyps: Last colonoscopy October 2018 with repeat recommended in 5 years, patient due now 2.  Left-sided abdominal pain: Prior episode of diverticulitis in 2022, now patient describes 2 episodes of a left-sided abdominal pain which resolved on its own after about 12 hours and the use of Levsin around times and she was constipated; consider diverticular spasm versus IBS versus constipation   Plan: 1.  Discussed with patient that if she feels constipated then she needs to start taking MiraLAX on a daily basis to avoid  constipation as this can lead to diverticulitis. 2.  Also recommend she back off to a low fiber/low residue or even clear liquid diet for a few days to see if symptoms resolve on their own.  If symptoms last longer than Mandy to 72 hours or getting worse or she runs a fever or has other symptoms then she needs to call and let us know.  At that time would recommend antibiotics. 3.  Schedule patient for her surveillance colonoscopy in the [place] with Dr. Henrene Pastor.  Did provide the patient a detailed list of risks for the procedures and she agrees to proceed. 4.  Patient follow in clinic per recommendations from Dr. Henrene Pastor after time of procedure.   Mandy Newer, PA-C [facility] 01/13/2022, 1:32 PM

## 2022-02-02 ENCOUNTER — Telehealth: Payer: Self-pay | Admitting: *Deleted

## 2022-02-02 NOTE — Telephone Encounter (Signed)
Follow up call attempt.  LVM to call if any questions or concerns. 

## 2022-02-16 ENCOUNTER — Other Ambulatory Visit: Payer: Self-pay | Admitting: Gastroenterology

## 2022-02-16 ENCOUNTER — Other Ambulatory Visit (HOSPITAL_COMMUNITY): Payer: Self-pay

## 2022-02-16 MED ORDER — HYOSCYAMINE SULFATE SL 0.125 MG SL SUBL
1.0000 | SUBLINGUAL_TABLET | Freq: Four times a day (QID) | SUBLINGUAL | 1 refills | Status: AC | PRN
  Filled 2022-02-16: qty 30, 8d supply, fill #0

## 2022-02-25 ENCOUNTER — Ambulatory Visit: Payer: 59 | Admitting: Internal Medicine

## 2022-03-08 ENCOUNTER — Other Ambulatory Visit (HOSPITAL_COMMUNITY): Payer: Self-pay

## 2022-04-23 ENCOUNTER — Other Ambulatory Visit (HOSPITAL_COMMUNITY): Payer: Self-pay

## 2022-05-12 ENCOUNTER — Other Ambulatory Visit (HOSPITAL_COMMUNITY): Payer: Self-pay

## 2022-05-14 ENCOUNTER — Telehealth: Payer: Commercial Managed Care - PPO | Admitting: Family Medicine

## 2022-05-14 ENCOUNTER — Other Ambulatory Visit: Payer: Self-pay

## 2022-05-14 ENCOUNTER — Other Ambulatory Visit (HOSPITAL_COMMUNITY): Payer: Self-pay

## 2022-05-14 DIAGNOSIS — J9801 Acute bronchospasm: Secondary | ICD-10-CM

## 2022-05-14 MED ORDER — PREDNISONE 20 MG PO TABS
40.0000 mg | ORAL_TABLET | Freq: Every day | ORAL | 0 refills | Status: AC
  Filled 2022-05-14: qty 10, 5d supply, fill #0

## 2022-05-14 NOTE — Progress Notes (Signed)
Visit for Asthma  Based on what you have shared with me, it looks like you may have a flare up of your asthma.  Asthma is a chronic (ongoing) lung disease which results in airway obstruction, inflammation and hyper-responsiveness.   Asthma symptoms vary from person to person, with common symptoms including nighttime awakening and decreased ability to participate in normal activities as a result of shortness of breath. It is often triggered by changes in weather, changes in the season, changes in air temperature, or inside (home, school, daycare or work) allergens such as animal dander, mold, mildew, woodstoves or cockroaches.   It can also be triggered by hormonal changes, extreme emotion, physical exertion or an upper respiratory tract illness.     It is important to identify the trigger, and then eliminate or avoid the trigger if possible.   If you have been prescribed medications to be taken on a regular basis, it is important to follow the asthma action plan and to follow guidelines to adjust medication in response to increasing symptoms of decreased peak expiratory flow rate  Treatment: I have prescribed: Prednisone 40 mg to take once daily with breakfast for 5 days  HOME CARE Only take medications as instructed by your medical team. Consider wearing a mask or scarf to improve breathing air temperature have been shown to decrease irritation and decrease exacerbations Get rest. Taking a steamy shower or using a humidifier may help nasal congestion sand ease sore throat pain. You can place a towel over your head and breathe in the steam from hot water coming from a faucet. Using a saline nasal spray works much the same way.  Cough drops, hare candies and sore throat lozenges may ease your cough.  Avoid close contacts especially the very you and the elderly Cover your mouth if you cough or sneeze Always remember to wash your  hands.    GET HELP RIGHT AWAY IF: You develop worsening symptoms; breathlessness at rest, drowsy, confused or agitated, unable to speak in full sentences You have coughing fits You develop a severe headache or visual changes You develop shortness of breath, difficulty breathing or start having chest pain Your symptoms persist after you have completed your treatment plan If your symptoms do not improve within 10 days  MAKE SURE YOU Understand these instructions. Will watch your condition. Will get help right away if you are not doing well or get worse.   Your e-visit answers were reviewed by a board certified advanced clinical practitioner to complete your personal care plan, Depending upon the condition, your plan could have included both over the counter or prescription medications.   Please review your pharmacy choice. Your safety is important to [place]. If you have drug allergies check your prescription carefully.  You can use MyChart to ask questions about today's visit, request a non-urgent  call back, or ask for a work or school excuse for 24 hours related to this e-Visit. If it has been greater than 24 hours you will need to follow up with your provider, or enter a new e-Visit to address those concerns.   You will get an e-mail in the next two days asking about your experience. I hope that your e-visit has been valuable and will speed your recovery. Thank you for using e-visits.  I provided 5 minutes of non face-to-face time during this encounter for chart review, medication and order placement, as well as and documentation.

## 2022-05-17 ENCOUNTER — Other Ambulatory Visit (HOSPITAL_COMMUNITY): Payer: Self-pay

## 2022-05-17 MED ORDER — FLUTICASONE PROPIONATE 50 MCG/ACT NA SUSP
1.0000 | Freq: Two times a day (BID) | NASAL | 2 refills | Status: DC
  Filled 2022-05-17: qty 16, 30d supply, fill #0
  Filled 2022-06-29: qty 16, 30d supply, fill #1
  Filled 2022-07-23: qty 16, 30d supply, fill #2

## 2022-06-07 ENCOUNTER — Ambulatory Visit: Payer: Commercial Managed Care - PPO | Admitting: Allergy

## 2022-06-28 ENCOUNTER — Other Ambulatory Visit (HOSPITAL_COMMUNITY): Payer: Self-pay

## 2022-06-28 ENCOUNTER — Telehealth: Payer: Commercial Managed Care - PPO | Admitting: Physician Assistant

## 2022-06-28 DIAGNOSIS — J4521 Mild intermittent asthma with (acute) exacerbation: Secondary | ICD-10-CM | POA: Diagnosis not present

## 2022-06-28 MED ORDER — PREDNISONE 20 MG PO TABS
40.0000 mg | ORAL_TABLET | Freq: Every day | ORAL | 0 refills | Status: DC
  Filled 2022-06-28: qty 14, 7d supply, fill #0

## 2022-06-28 MED ORDER — ALBUTEROL SULFATE HFA 108 (90 BASE) MCG/ACT IN AERS
1.0000 | INHALATION_SPRAY | Freq: Four times a day (QID) | RESPIRATORY_TRACT | 0 refills | Status: AC | PRN
  Filled 2022-06-28: qty 6.7, 25d supply, fill #0

## 2022-06-28 NOTE — Progress Notes (Signed)
Visit for Asthma  Based on what you have shared with me, it looks like you may have a flare up of your asthma.  Asthma is a chronic (ongoing) lung disease which results in airway obstruction, inflammation and hyper-responsiveness.   Asthma symptoms vary from person to person, with common symptoms including nighttime awakening and decreased ability to participate in normal activities as a result of shortness of breath. It is often triggered by changes in weather, changes in the season, changes in air temperature, or inside (home, school, daycare or work) allergens such as animal dander, mold, mildew, woodstoves or cockroaches.   It can also be triggered by hormonal changes, extreme emotion, physical exertion or an upper respiratory tract illness.     It is important to identify the trigger, and then eliminate or avoid the trigger if possible.   If you have been prescribed medications to be taken on a regular basis, it is important to follow the asthma action plan and to follow guidelines to adjust medication in response to increasing symptoms of decreased peak expiratory flow rate  Treatment: I have prescribed: Albuterol (Proventil HFA; Ventolin HFA) 108 (90 Base) MCG/ACT Inhaler 2 puffs into the lungs every six hours as needed for wheezing or shortness of breath and Prednisone '40mg'$  by mouth per day for 5 - 7 days  HOME CARE Only take medications as instructed by your medical team. Consider wearing a mask or scarf to improve breathing air temperature have been shown to decrease irritation and decrease exacerbations Get rest. Taking a steamy shower or using a humidifier may help nasal congestion sand ease sore throat pain. You can place a towel over your head and breathe in the steam from hot water coming from a faucet. Using a saline nasal spray works much the same way.  Cough drops, hare  candies and sore throat lozenges may ease your cough.  Avoid close contacts especially the very you and the elderly Cover your mouth if you cough or sneeze Always remember to wash your hands.    GET HELP RIGHT AWAY IF: You develop worsening symptoms; breathlessness at rest, drowsy, confused or agitated, unable to speak in full sentences You have coughing fits You develop a severe headache or visual changes You develop shortness of breath, difficulty breathing or start having chest pain Your symptoms persist after you have completed your treatment plan If your symptoms do not improve within 10 days  MAKE SURE YOU Understand these instructions. Will watch your condition. Will get help right away if you are not doing well or get worse.   Your e-visit answers were reviewed by a board certified advanced clinical practitioner to complete your personal care plan, Depending upon the condition, your plan could have included both over the counter or prescription medications.   Please review your pharmacy choice. Your safety is important to [place]. If you have drug allergies check your prescription carefully.  You can use MyChart to ask questions about today's visit, request a non-urgent  call back, or ask for a work or school excuse for 24 hours related to this e-Visit. If it has been greater than 24 hours you will need to follow up with your provider, or enter a new e-Visit to address those concerns.   You will get an e-mail in the next two days asking about your experience. I hope that your e-visit has been valuable and will speed your recovery. Thank you for using e-visits.  I have spent 5 minutes in review of  e-visit questionnaire, review and updating patient chart, medical decision making and response to patient.   Mar Daring, PA-C

## 2022-06-29 ENCOUNTER — Other Ambulatory Visit: Payer: Self-pay

## 2022-06-29 ENCOUNTER — Other Ambulatory Visit (HOSPITAL_COMMUNITY): Payer: Self-pay

## 2022-06-29 DIAGNOSIS — J069 Acute upper respiratory infection, unspecified: Secondary | ICD-10-CM | POA: Diagnosis not present

## 2022-06-29 MED ORDER — PROMETHAZINE-DM 6.25-15 MG/5ML PO SYRP
5.0000 mL | ORAL_SOLUTION | Freq: Four times a day (QID) | ORAL | 0 refills | Status: AC
  Filled 2022-06-29: qty 140, 7d supply, fill #0

## 2022-06-29 MED ORDER — GUAIFENESIN ER 1200 MG PO TB12
ORAL_TABLET | ORAL | 0 refills | Status: DC
  Filled 2022-06-29: qty 10, fill #0

## 2022-06-29 NOTE — Progress Notes (Unsigned)
New Patient Note  RE: Mandy Riggs MRN: [id number] DOB: 12/18/1970 Date of Office Visit: 06/30/2022  Consult requested by: Antony Contras, MD Primary care provider: Antony Contras, MD  Chief Complaint: No chief complaint on file.  History of Present Illness: I had the pleasure of seeing Mandy Riggs for initial evaluation at the [facility] on 06/29/2022. She is a 52 y.o. female, who is referred here by Antony Contras, MD for the evaluation of asthma.  She reports symptoms of *** chest tightness, shortness of breath, coughing, wheezing, nocturnal awakenings for *** years. Current medications include *** which help. She reports *** using aerochamber with inhalers. She tried the following inhalers: ***. Main triggers are ***allergies, infections, weather changes, smoke, exercise, pet exposure. In the last month, frequency of symptoms: ***x/week. Frequency of nocturnal symptoms: ***x/month. Frequency of SABA use: ***x/week. Interference with physical activity: ***. Sleep is ***disturbed. In the last 12 months, emergency room visits/urgent care visits/doctor office visits or hospitalizations due to respiratory issues: ***. In the last 12 months, oral steroids courses: ***. Lifetime history of hospitalization for respiratory issues: ***. Prior intubations: ***. Asthma was diagnosed at age *** by ***. History of pneumonia: ***. She was evaluated by allergist ***pulmonologist in the past. Smoking exposure: ***. Up to date with flu vaccine: ***. Up to date with pneumonia vaccine: ***. Up to date with COVID-19 vaccine: ***. Prior Covid-19 infection: ***. History of reflux: ***.  Assessment and Plan: Mandy Riggs is a 52 y.o. female with: No problem-specific Assessment & Plan notes found for this encounter.  No follow-ups on file.  No orders of the defined types were placed in this encounter.  Lab Orders  No laboratory test(s) ordered today    Other allergy screening: Asthma:  {Blank single:19197::"yes","no"} Rhino conjunctivitis: {Blank single:19197::"yes","no"} Food allergy: {Blank single:19197::"yes","no"} Medication allergy: {Blank single:19197::"yes","no"} Hymenoptera allergy: {Blank single:19197::"yes","no"} Urticaria: {Blank single:19197::"yes","no"} Eczema:{Blank single:19197::"yes","no"} History of recurrent infections suggestive of immunodeficency: {Blank single:19197::"yes","no"}  Diagnostics: Spirometry:  Tracings reviewed. Her effort: {Blank single:19197::"Good reproducible efforts.","It was hard to get consistent efforts and there is a question as to whether this reflects a maximal maneuver.","Poor effort, data can not be interpreted."} FVC: ***L FEV1: ***L, ***% predicted FEV1/FVC ratio: ***% Interpretation: {Blank single:19197::"Spirometry consistent with mild obstructive disease","Spirometry consistent with moderate obstructive disease","Spirometry consistent with severe obstructive disease","Spirometry consistent with possible restrictive disease","Spirometry consistent with mixed obstructive and restrictive disease","Spirometry uninterpretable due to technique","Spirometry consistent with normal pattern","No overt abnormalities noted given today's efforts"}.  Please see scanned spirometry results for details.  Skin Testing: {Blank single:19197::"Select foods","Environmental allergy panel","Environmental allergy panel and select foods","Food allergy panel","None","Deferred due to recent antihistamines use"}. *** Results discussed with patient/family.   Past Medical History: Patient Active Problem List   Diagnosis Date Noted  . Diverticulitis 08/14/2019  . HYPERTENSION 12/19/2008  . ASTHMA 12/19/2008  . HEADACHE, CHRONIC 12/19/2008  . COUGH 12/19/2008   Past Medical History:  Diagnosis Date  . Acid reflux    occasionally  . Anemia   . Anxiety   . Diverticulitis   . Hypertension    states under control with meds., has been on med. x  "a while", per pt.  . Mass of left axilla 06/2015  . Peptic ulcer disease   . Sickle cell trait ([place])   . Wears partial dentures    upper   Past Surgical History: Past Surgical History:  Procedure Laterality Date  . ANAL FISTULECTOMY    . BREAST BIOPSY Right    benign  core  . MASS EXCISION Left 07/21/2015   Procedure: EXCISION MASS LEFT AXILLA;  Surgeon: Excell Seltzer, MD;  Location: [facility];  Service: General;  Laterality: Left;  . TONSILLECTOMY     Medication List:  Current Outpatient Medications  Medication Sig Dispense Refill  . albuterol (PROAIR HFA) 108 (90 Base) MCG/ACT inhaler Inhale 1-2 puffs into the lungs every 6 (six) hours as needed. 6.7 g 0  . ALPRAZolam (XANAX) 0.5 MG tablet Take 0.5-1 tablets (0.25-0.5 mg total) by mouth every 12 (twelve) hours as needed. 10 tablet 0  . amLODipine (NORVASC) 5 MG tablet Take 1 tablet (5 mg total) by mouth daily. 90 tablet 3  . esomeprazole (NEXIUM) 20 MG capsule Take 20 mg by mouth as needed. (Patient not taking: Reported on 02/01/2022)    . ferrous sulfate 325 (65 FE) MG tablet Take 325 mg by mouth daily with breakfast.    . fluticasone (FLONASE) 50 MCG/ACT nasal spray Place 1 spray into both nostrils 2 (two) times daily. 16 g 2  . fluticasone (FLONASE) 50 MCG/ACT nasal spray Place 1 spray into both nostrils 2 (two) times daily. 16 g 2  . Guaifenesin 1200 MG TB12 1 tablet as needed Orally every 12 hrs 5 days 10 tablet 0  . Hyoscyamine Sulfate SL (OSCIMIN) 0.125 MG SUBL Place 1 tablet under the tongue every 6 (six) hours as needed. 30 tablet 1  . montelukast (SINGULAIR) 10 MG tablet Take 1 tablet (10 mg total) by mouth daily. 90 tablet 3  . predniSONE (DELTASONE) 20 MG tablet Take 2 tablets (40 mg total) by mouth daily with breakfast. 14 tablet 0  . promethazine-dextromethorphan (PROMETHAZINE-DM) 6.25-15 MG/5ML syrup Take 5 mLs by mouth every 6 (six) hours for 7 days 140 mL 0  . telmisartan (MICARDIS) 20 MG tablet  Take 1 tablet (20 mg total) by mouth daily. 90 tablet 3  . VITAMIN D, ERGOCALCIFEROL, PO Take 1,000 mg by mouth daily.    Marland Kitchen Zoster Vaccine Adjuvanted [facility]) injection To be administered by pharmacist 0.5 mL 1   Current Facility-Administered Medications  Medication Dose Route Frequency Provider Last Rate Last Admin  . 0.9 %  sodium chloride infusion  500 mL Intravenous Continuous Irene Shipper, MD       Allergies: Allergies  Allergen Reactions  . Latex Hives and Itching    Other reaction(s): Not available  . Ace Inhibitors Cough  . Hct [Hydrochlorothiazide] Other (See Comments)    cramping  . Hydrocodone Itching    Other reaction(s): Not available   Social History: Social History   Socioeconomic History  . Marital status: Single    Spouse name: Not on file  . Number of children: 2  . Years of education: Not on file  . Highest education level: Not on file  Occupational History  . Occupation: Scientist, research (medical): [employer]  Tobacco Use  . Smoking status: Never  . Smokeless tobacco: Never  Vaping Use  . Vaping Use: Never used  Substance and Sexual Activity  . Alcohol use: No  . Drug use: No  . Sexual activity: Not on file  Other Topics Concern  . Not on file  Social History Narrative  . Not on file   Social Determinants of Health   Financial Resource Strain: Not on file  Food Insecurity: Not on file  Transportation Needs: Not on file  Physical Activity: Not on file  Stress: Not on file  Social Connections:  Not on file   Lives in a ***. Smoking: *** Occupation: ***  Environmental HistoryFreight forwarder in the house: Estate agent in the family room: {Blank single:19197::"yes","no"} Carpet in the bedroom: {Blank single:19197::"yes","no"} Heating: {Blank single:19197::"electric","gas","heat pump"} Cooling: {Blank single:19197::"central","window","heat pump"} Pet: {Blank single:19197::"yes  ***","no"}  Family History: Family History  Problem Relation Age of Onset  . Hypertension Mother   . Colon polyps Mother   . Diabetes Mother   . Other Father        does not know fathers medical history  . Heart disease Maternal Grandmother   . Breast cancer Paternal Grandmother        80s  . Stomach cancer Neg Hx   . Colon cancer Neg Hx   . Rectal cancer Neg Hx   . Esophageal cancer Neg Hx    Problem                               Relation Asthma                                   *** Eczema                                *** Food allergy                          *** Allergic rhino conjunctivitis     ***  Review of Systems  Constitutional:  Negative for appetite change, chills, fever and unexpected weight change.  HENT:  Negative for congestion and rhinorrhea.   Eyes:  Negative for itching.  Respiratory:  Negative for cough, chest tightness, shortness of breath and wheezing.   Cardiovascular:  Negative for chest pain.  Gastrointestinal:  Negative for abdominal pain.  Genitourinary:  Negative for difficulty urinating.  Skin:  Negative for rash.  Neurological:  Negative for headaches.   Objective: There were no vitals taken for this visit. There is no height or weight on file to calculate BMI. Physical Exam Vitals and nursing note reviewed.  Constitutional:      Appearance: Normal appearance. She is well-developed.  HENT:     Head: Normocephalic and atraumatic.     Right Ear: Tympanic membrane and external ear normal.     Left Ear: Tympanic membrane and external ear normal.     Nose: Nose normal.     Mouth/Throat:     Mouth: Mucous membranes are moist.     Pharynx: Oropharynx is clear.  Eyes:     Conjunctiva/sclera: Conjunctivae normal.  Cardiovascular:     Rate and Rhythm: Normal rate and regular rhythm.     Heart sounds: Normal heart sounds. No murmur heard.    No friction rub. No gallop.  Pulmonary:     Effort: Pulmonary effort is normal.     Breath sounds:  Normal breath sounds. No wheezing, rhonchi or rales.  Musculoskeletal:     Cervical back: Neck supple.  Skin:    General: Skin is warm.     Findings: No rash.  Neurological:     Mental Status: She is alert and oriented to person, place, and time.  Psychiatric:        Behavior: Behavior normal.  The plan was reviewed with the patient/family, and all  questions/concerned were addressed.  It was my pleasure to see Onnalee today and participate in her care. Please feel free to contact me with any questions or concerns.  Sincerely,  Rexene Alberts, DO Allergy & Immunology  [facility] office: [place] office: [phone]

## 2022-06-30 ENCOUNTER — Other Ambulatory Visit: Payer: Self-pay

## 2022-06-30 ENCOUNTER — Encounter: Payer: Self-pay | Admitting: Allergy

## 2022-06-30 ENCOUNTER — Other Ambulatory Visit (HOSPITAL_COMMUNITY): Payer: Self-pay

## 2022-06-30 ENCOUNTER — Ambulatory Visit: Payer: Commercial Managed Care - PPO | Admitting: Allergy

## 2022-06-30 VITALS — BP 130/72 | HR 86 | Temp 98.1°F | Resp 16 | Ht 63.0 in | Wt 201.1 lb

## 2022-06-30 DIAGNOSIS — J4540 Moderate persistent asthma, uncomplicated: Secondary | ICD-10-CM | POA: Insufficient documentation

## 2022-06-30 DIAGNOSIS — J3089 Other allergic rhinitis: Secondary | ICD-10-CM | POA: Diagnosis not present

## 2022-06-30 DIAGNOSIS — K219 Gastro-esophageal reflux disease without esophagitis: Secondary | ICD-10-CM

## 2022-06-30 DIAGNOSIS — J45998 Other asthma: Secondary | ICD-10-CM | POA: Diagnosis not present

## 2022-06-30 MED ORDER — AZELASTINE HCL 0.1 % NA SOLN
1.0000 | Freq: Two times a day (BID) | NASAL | 5 refills | Status: AC | PRN
  Filled 2022-06-30: qty 30, 30d supply, fill #0
  Filled 2022-07-23: qty 30, 30d supply, fill #1

## 2022-06-30 MED ORDER — FLUTICASONE-SALMETEROL 100-50 MCG/ACT IN AEPB
1.0000 | INHALATION_SPRAY | Freq: Two times a day (BID) | RESPIRATORY_TRACT | 3 refills | Status: AC
  Filled 2022-06-30: qty 60, 30d supply, fill #0
  Filled 2022-07-23 – 2022-07-25 (×2): qty 60, 30d supply, fill #1
  Filled 2022-08-23: qty 60, 30d supply, fill #2
  Filled 2022-09-22: qty 60, 30d supply, fill #3

## 2022-06-30 NOTE — Assessment & Plan Note (Addendum)
Noted shortness of breath, chest tightness, coughing with posttussive emesis and wheezing for 1 year.  Using albuterol as needed with some benefit at times.  Triggers include smoke and dust.  2 courses of prednisone in the last year.  Takes PPI as needed for reflux.  Concerned about allergic triggers. In the past had lisinopril induced cough. Today's skin testing showed: Positive to perennial mold only. Negative to common foods.  Today's spirometry showed: No overt abnormalities noted given today's efforts with 8% improvement in FEV1 post bronchodilator treatment. Clinically feeling slightly improved.  Okay to take prednisone as prescribed. Clinical symptoms and spirometry concerning for asthma/reactive airway disease. Daily controller medication(s): start Wixela 181mg 1 puff twice a day. Demonstrated proper use. Let [place] if too expensive.  Continue Singulair (montelukast) '10mg'$  daily at night. May use albuterol rescue inhaler 2 puffs every 4 to 6 hours as needed for shortness of breath, chest tightness, coughing, and wheezing. Monitor frequency of use.  Spacer given and demonstrated proper use with inhaler. Patient understood technique and all questions/concerned were addressed.  Get spirometry at next visit.

## 2022-06-30 NOTE — Patient Instructions (Addendum)
Today's skin testing showed: Positive to perennial mold only. Negative to common foods.   Results given.  Breathing Okay to take prednisone as prescribed. Daily controller medication(s): start Wixela 151mg 1 puff twice a day. Demonstrated proper use. Let [place] if too expensive.  Continue Singulair (montelukast) '10mg'$  daily at night. May use albuterol rescue inhaler 2 puffs every 4 to 6 hours as needed for shortness of breath, chest tightness, coughing, and wheezing. Monitor frequency of use.  Spacer given and demonstrated proper use with inhaler. Patient understood technique and all questions/concerned were addressed.  Breathing control goals:  Full participation in all desired activities (may need albuterol before activity) Albuterol use two times or less a week on average (not counting use with activity) Cough interfering with sleep two times or less a month Oral steroids no more than once a year No hospitalizations   Environmental allergies Start environmental control measures as below. Continue Singulair (montelukast) '10mg'$  daily at night. Use azelastine nasal spray 1-2 sprays per nostril twice a day as needed for runny nose/drainage. Use over the counter antihistamines such as Zyrtec (cetirizine), Claritin (loratadine), Allegra (fexofenadine), or Xyzal (levocetirizine) daily as needed. May take twice a day during allergy flares. May switch antihistamines every few months.  Heartburn: See handout for lifestyle and dietary modifications.  Follow up in 2 months or sooner if needed.    Mold Control Mold and fungi can grow on a variety of surfaces provided certain temperature and moisture conditions exist.  Outdoor molds grow on plants, decaying vegetation and soil. The major outdoor mold, Alternaria and Cladosporium, are found in very high numbers during hot and dry conditions. Generally, a late summer - fall peak is seen for common outdoor fungal spores. Rain will temporarily  lower outdoor mold spore count, but counts rise rapidly when the rainy period ends. The most important indoor molds are Aspergillus and Penicillium. Dark, humid and poorly ventilated basements are ideal sites for mold growth. The next most common sites of mold growth are the bathroom and the kitchen. Outdoor (Seasonal) Mold Control Use air conditioning and keep windows closed. Avoid exposure to decaying vegetation. Avoid leaf raking. Avoid grain handling. Consider wearing a face mask if working in moldy areas.  Indoor (Perennial) Mold Control  Maintain humidity below 50%. Get rid of mold growth on hard surfaces with water, detergent and, if necessary, 5% bleach (do not mix with other cleaners). Then dry the area completely. If mold covers an area more than 10 square feet, consider hiring an indoor environmental professional. For clothing, washing with soap and water is best. If moldy items cannot be cleaned and dried, throw them away. Remove sources e.g. contaminated carpets. Repair and seal leaking roofs or pipes. Using dehumidifiers in damp basements may be helpful, but empty the water and clean units regularly to prevent mildew from forming. All rooms, especially basements, bathrooms and kitchens, require ventilation and cleaning to deter mold and mildew growth. Avoid carpeting on concrete or damp floors, and storing items in damp areas.

## 2022-06-30 NOTE — Assessment & Plan Note (Signed)
Main complaint of postnasal drip and tried Flonase, Singulair, Xyzal with some benefit.  No prior allergy evaluation. Today's skin testing showed: Positive to perennial mold only. Discussed with patient that above allergen does not explain her symptoms.  Start environmental control measures as below. Continue Singulair (montelukast) '10mg'$  daily at night. Use azelastine nasal spray 1-2 sprays per nostril twice a day as needed for runny nose/drainage. Use over the counter antihistamines such as Zyrtec (cetirizine), Claritin (loratadine), Allegra (fexofenadine), or Xyzal (levocetirizine) daily as needed. May take twice a day during allergy flares. May switch antihistamines every few months. If no improvement will get bloodwork and/or ENT referral next.

## 2022-06-30 NOTE — Assessment & Plan Note (Signed)
See handout for lifestyle and dietary modifications.

## 2022-07-08 DIAGNOSIS — M25562 Pain in left knee: Secondary | ICD-10-CM | POA: Diagnosis not present

## 2022-07-23 ENCOUNTER — Telehealth: Payer: Commercial Managed Care - PPO | Admitting: Family Medicine

## 2022-07-23 ENCOUNTER — Other Ambulatory Visit: Payer: Self-pay

## 2022-07-23 ENCOUNTER — Other Ambulatory Visit (HOSPITAL_COMMUNITY): Payer: Self-pay

## 2022-07-23 DIAGNOSIS — J40 Bronchitis, not specified as acute or chronic: Secondary | ICD-10-CM

## 2022-07-23 DIAGNOSIS — J4521 Mild intermittent asthma with (acute) exacerbation: Secondary | ICD-10-CM | POA: Diagnosis not present

## 2022-07-23 MED ORDER — PREDNISONE 20 MG PO TABS: 40.0000 mg | ORAL_TABLET | Freq: Every day | ORAL | 0 refills | Status: AC

## 2022-07-23 MED ORDER — AZITHROMYCIN 250 MG PO TABS: ORAL_TABLET | ORAL | 0 refills | Status: AC

## 2022-07-23 MED ORDER — BENZONATATE 200 MG PO CAPS: 200.0000 mg | ORAL_CAPSULE | Freq: Two times a day (BID) | ORAL | 0 refills | Status: AC | PRN

## 2022-07-23 NOTE — Addendum Note (Signed)
Addended by: Dellia Nims on: 07/23/2022 11:33 AM   Modules accepted: Orders

## 2022-07-23 NOTE — [facility] Notes (Signed)
E-Visit for Cough  We are sorry that you are not feeling well.  Here is how we plan to help!  Based on your presentation I believe you most likely have A cough due to a virus.  This is called viral bronchitis and is best treated by rest, plenty of fluids and control of the cough.  You may use Ibuprofen or Tylenol as directed to help your symptoms.     In addition you may use A prescription cough medication called Tessalon Perles 100mg . You may take 1-2 capsules every 8 hours as needed for your cough.  Prednisone 20 mg po bid for 5 days.   From your responses in the eVisit questionnaire you describe inflammation in the upper respiratory tract which is causing a significant cough.  This is commonly called Bronchitis and has four common causes:   Allergies Viral Infections Acid Reflux Bacterial Infection Allergies, viruses and acid reflux are treated by controlling symptoms or eliminating the cause. An example might be a cough caused by taking certain blood pressure medications. You stop the cough by changing the medication. Another example might be a cough caused by acid reflux. Controlling the reflux helps control the cough.  USE OF BRONCHODILATOR ("RESCUE") INHALERS: There is a risk from using your bronchodilator too frequently.  The risk is that over-reliance on a medication which only relaxes the muscles surrounding the breathing tubes can reduce the effectiveness of medications prescribed to reduce swelling and congestion of the tubes themselves.  Although you feel brief relief from the bronchodilator inhaler, your asthma may actually be worsening with the tubes becoming more swollen and filled with mucus.  This can delay other crucial treatments, such as oral steroid medications. If you need to use a bronchodilator inhaler daily, several times per day, you should discuss this with your provider.  There are probably better treatments that could be used to keep your asthma under control.      HOME CARE Only take medications as instructed by your medical team. Complete the entire course of an antibiotic. Drink plenty of fluids and get plenty of rest. Avoid close contacts especially the very young and the elderly Cover your mouth if you cough or cough into your sleeve. Always remember to wash your hands A steam or ultrasonic humidifier can help congestion.   GET HELP RIGHT AWAY IF: You develop worsening fever. You become short of breath You cough up blood. Your symptoms persist after you have completed your treatment plan MAKE SURE YOU  Understand these instructions. Will watch your condition. Will get help right away if you are not doing well or get worse.    Thank you for choosing an e-visit.  Your e-visit answers were reviewed by a board certified advanced clinical practitioner to complete your personal care plan. Depending upon the condition, your plan could have included both over the counter or prescription medications.  Please review your pharmacy choice. Make sure the pharmacy is open so you can pick up prescription now. If there is a problem, you may contact your provider through [employer] and have the prescription routed to another pharmacy.  Your safety is important to [place]. If you have drug allergies check your prescription carefully.   For the next 24 hours you can use MyChart to ask questions about today's visit, request a non-urgent call back, or ask for a work or school excuse. You will get an email in the next two days asking about your experience. I hope that your e-visit  has been valuable and will speed your recovery.    have provided 5 minutes of non face to face time during this encounter for chart review and documentation.

## 2022-07-27 ENCOUNTER — Other Ambulatory Visit (HOSPITAL_COMMUNITY): Payer: Self-pay

## 2022-08-09 ENCOUNTER — Other Ambulatory Visit (HOSPITAL_COMMUNITY): Payer: Self-pay

## 2022-08-09 DIAGNOSIS — R051 Acute cough: Secondary | ICD-10-CM | POA: Diagnosis not present

## 2022-08-09 MED ORDER — IPRATROPIUM BROMIDE 0.06 % NA SOLN
2.0000 | Freq: Four times a day (QID) | NASAL | 0 refills | Status: AC
  Filled 2022-08-09: qty 15, 19d supply, fill #0

## 2022-08-09 MED ORDER — HYDROCOD POLI-CHLORPHE POLI ER 10-8 MG/5ML PO SUER
5.0000 mL | Freq: Two times a day (BID) | ORAL | 0 refills | Status: AC
  Filled 2022-08-09: qty 120, 12d supply, fill #0

## 2022-08-10 ENCOUNTER — Ambulatory Visit: Payer: Commercial Managed Care - PPO | Attending: Cardiovascular Disease | Admitting: Cardiovascular Disease

## 2022-08-10 ENCOUNTER — Other Ambulatory Visit (HOSPITAL_COMMUNITY): Payer: Self-pay

## 2022-08-10 ENCOUNTER — Encounter: Payer: Self-pay | Admitting: Cardiovascular Disease

## 2022-08-10 VITALS — BP 122/80 | HR 98 | Ht 63.0 in | Wt 204.8 lb

## 2022-08-10 DIAGNOSIS — R0602 Shortness of breath: Secondary | ICD-10-CM | POA: Diagnosis not present

## 2022-08-10 DIAGNOSIS — R079 Chest pain, unspecified: Secondary | ICD-10-CM | POA: Diagnosis not present

## 2022-08-10 DIAGNOSIS — I10 Essential (primary) hypertension: Secondary | ICD-10-CM | POA: Diagnosis not present

## 2022-08-10 DIAGNOSIS — R000 Tachycardia, unspecified: Secondary | ICD-10-CM | POA: Diagnosis not present

## 2022-08-10 DIAGNOSIS — D649 Anemia, unspecified: Secondary | ICD-10-CM | POA: Diagnosis not present

## 2022-08-10 MED ORDER — METOPROLOL TARTRATE 100 MG PO TABS
100.0000 mg | ORAL_TABLET | Freq: Once | ORAL | 0 refills | Status: AC
  Filled 2022-08-10: qty 1, 1d supply, fill #0

## 2022-08-10 MED ORDER — IVABRADINE HCL 5 MG PO TABS
5.0000 mg | ORAL_TABLET | Freq: Once | ORAL | 0 refills | Status: AC
  Filled 2022-08-10: qty 1, 1d supply, fill #0

## 2022-08-10 NOTE — Progress Notes (Signed)
Cardiology Office Note:    Date:  08/10/2022   ID:  Mandy Riggs, DOB [birth year], MRN [id number]  PCP:  Tally Joe, MD   [facility] Providers Cardiologist:  None     Referring MD: Tally Joe, MD   Chief Complaint  Patient presents with   Consult  Mandy Riggs is a 52 y.o. female who is being seen today for the evaluation of exertional dyspnea and chest tightness at the request of Tally Joe, MD.   History of Present Illness:    Mandy Riggs is a 52 y.o. female with a hx of hypertension, GERD and peptic ulcer disease, recently postmenopausal.  She has been battling progressively worsening exertional shortness of breath over the last roughly 1 year.  She has been receiving treatment with Advair and albuterol with some relief.  However her dyspnea is mostly related to exertion and often with activity she also feels a heaviness in her chest that resolves if she slows down or stops and rests.  In particular she has noticed chest tightness when she is walking uphill.  Recently she has been having persistent tachycardia.  Her heart rate has been as fast as 110-120.  She stopped taking Advair and albuterol about 3 days ago, but she remains tachycardic.  She received a 5-day course of prednisone 20 mg twice daily for persistent cough.  Unclear if this helped.  She has also gained about 10 pounds in weight just in the last few weeks.  She habitually sleeps in a recliner and believes that her breathing is worse if she lies flat.  She has mild chronic pedal edema and takes amlodipine.  She has not had any chest pain at rest.  A fFew days ago while walking in a store she began to feel unwell and somewhat dizzy and actually fell, but did not experience full-blown syncope.  She has never smoked and she does not have diabetes mellitus.  She is not sure about her cholesterol numbers.  She has chronic anemia and takes iron supplements.  There is a mention in the chart  that she has sickle cell trait.  She is on chronic proton pump inhibitor.  A chest x-ray performed in May 2023 was normal without evidence of cardiomegaly or lung infiltrates.  Pulmonary function test performed 06/30/2022 do appear to show evidence of asthma with a mild reduction in FVC and FEV1 that improved with bronchodilators.  There is no family history of early onset CAD, but she believes that her mother may have some issues with blockages in her carotid artery.  Her maternal grandmother still alive at age 47 but has congestive heart failure and that sounds like she has had a cardioversion for atrial fibrillation.  Her father as well as one of her older female siblings died of problems related to sugar diabetes.  They were young, passing away in their 32s.  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  Her daughter-in-law Levora Angel is a Engineer, civil (consulting) and used to work in [employer].  Past Medical History:  Diagnosis Date   Acid reflux    occasionally   Anemia    Anxiety    Asthma    Diverticulitis    Hypertension    states under control with meds., has been on med. x "a while", per pt.   Mass of left axilla 06/2015   Peptic ulcer disease    Recurrent upper respiratory infection (URI)    Sickle cell trait  Wears partial dentures    upper    Past Surgical History:  Procedure Laterality Date   ANAL FISTULECTOMY     BREAST BIOPSY Right    benign core   MASS EXCISION Left 07/21/2015   Procedure: EXCISION MASS LEFT AXILLA;  Surgeon: Glenna Fellows, MD;  Location: [facility];  Service: General;  Laterality: Left;   TONSILLECTOMY      Current Medications: Current Meds  Medication Sig   albuterol (PROAIR HFA) 108 (90 Base) MCG/ACT inhaler Inhale 1-2 puffs into the lungs every 6 (six) hours as needed.   amLODipine (NORVASC) 5 MG tablet Take 1 tablet (5 mg total) by mouth daily.   chlorpheniramine-HYDROcodone (TUSSIONEX) 10-8 MG/5ML Take 5 mLs by mouth every 12 (twelve) hours as  needed   esomeprazole (NEXIUM) 20 MG capsule Take 20 mg by mouth as needed.   ferrous sulfate 325 (65 FE) MG tablet Take 325 mg by mouth daily with breakfast.   fluticasone-salmeterol (WIXELA INHUB) 100-50 MCG/ACT AEPB Inhale 1 puff into the lungs 2 (two) times daily. Rinse mouth after each use.   ivabradine (CORLANOR) 5 MG TABS tablet Take 1 tablet (5 mg total) by mouth once for 1 dose. Take 2 hours before CT   metoprolol tartrate (LOPRESSOR) 100 MG tablet Take 1 tablet (100 mg total) by mouth once for 1 dose. PLEASE TAKE METOPROLOL 2 HOURS PRIOR TO CTA SCAN.   montelukast (SINGULAIR) 10 MG tablet Take 1 tablet (10 mg total) by mouth daily.   polycarbophil (FIBERCON) 625 MG tablet Take 625 mg by mouth as needed.   predniSONE (DELTASONE) 20 MG tablet Take 2 tablets (40 mg total) by mouth daily with breakfast.   promethazine-dextromethorphan (PROMETHAZINE-DM) 6.25-15 MG/5ML syrup Take 5 mLs by mouth every 6 (six) hours as needed for 7 days   telmisartan (MICARDIS) 20 MG tablet Take 1 tablet (20 mg total) by mouth daily.   VITAMIN D, ERGOCALCIFEROL, PO Take 1,000 mg by mouth daily.   Current Facility-Administered Medications for the 08/10/22 encounter (Office Visit) with Mykel Mohl, MD  Medication   0.9 %  sodium chloride infusion     Allergies:   Latex, Ace inhibitors, Hct [hydrochlorothiazide], and Hydrocodone   Social History   Socioeconomic History   Marital status: Single    Spouse name: Not on file   Number of children: 2   Years of education: Not on file   Highest education level: Not on file  Occupational History   Occupation: Cytogeneticist    Employer: [employer]  Tobacco Use   Smoking status: Never   Smokeless tobacco: Never  Vaping Use   Vaping Use: Never used  Substance and Sexual Activity   Alcohol use: No   Drug use: No   Sexual activity: Not on file  Other Topics Concern   Not on file  Social History Narrative   Not on file   Social  Determinants of Health   Financial Resource Strain: Not on file  Food Insecurity: Not on file  Transportation Needs: Not on file  Physical Activity: Not on file  Stress: Not on file  Social Connections: Not on file     Family History: The patient's family history includes Breast cancer in her paternal grandmother; Colon polyps in her mother; Diabetes in her mother; Heart disease in her maternal grandmother; Hypertension in her mother; Other in her father. There is no history of Stomach cancer, Colon cancer, Rectal cancer, or Esophageal cancer.  ROS:  Please see the history of present illness.     All other systems reviewed and are negative.  EKGs/Labs/Other Studies Reviewed:    The following studies were reviewed today: PFTs 06/30/2022 Labs on 11/19/2021 Cholesterol 171, HDL 54, LDL 105, triglycerides 58 Hemoglobin 10.8, creatinine 0.77, potassium 3.9, ALT 11, TSH 1.040  EKG:  EKG is  ordered today.  The ekg ordered today demonstrates sinus rhythm at 90 bpm with a PR interval that my measurement is normal at about 135 ms, nonspecific diffuse T wave flattening without ischemic changes.  Recent Labs: No results found for requested labs within last 365 days.  Labs on 11/19/2021 Hemoglobin 10.8, creatinine 0.77, potassium 3.9, ALT 11, TSH 1.040 Recent Lipid Panel No results found for: "CHOL", "TRIG", "HDL", "CHOLHDL", "VLDL", "LDLCALC", "LDLDIRECT" Labs on 11/19/2021 Cholesterol 171, HDL 54, LDL 105, triglycerides 58  Risk Assessment/Calculations:            Physical Exam:    VS:  BP 122/80 (BP Location: Left Arm, Patient Position: Sitting, Cuff Size: Large)   Pulse 98   Ht  (1.6 m)   Wt 204 lb 12.8 oz (92.9 kg)   SpO2 96%   BMI 36.28 kg/m     Wt Readings from Last 3 Encounters:  08/10/22 204 lb 12.8 oz (92.9 kg)  06/30/22 201 lb 1.6 oz (91.2 kg)  02/01/22 195 lb (88.5 kg)     GEN: Severely obese, well nourished, well developed in no acute distress HEENT:  Normal NECK: No JVD; No carotid bruits LYMPHATICS: No lymphadenopathy CARDIAC: Borderline tachycardic, RRR, no murmurs, rubs, gallops RESPIRATORY:  Clear to auscultation without rales, wheezing or rhonchi  ABDOMEN: Soft, non-tender, non-distended MUSCULOSKELETAL: 1+ bilateral ankle edema; No deformity  SKIN: Warm and dry NEUROLOGIC:  Alert and oriented x 3 PSYCHIATRIC:  Normal affect   ASSESSMENT:    1. Chest pain, unspecified type   2. Shortness of breath   3. HYPERTENSION   4. Tachycardia   5. Anemia, unspecified type    PLAN:    In order of problems listed above:  Exertional chest tightness: Worsening for exertional angina pectoris.  No overt ischemia on ECG.  Very limited risk factor profile (she only went through menopause maybe a year ago, blood pressure is generally well treated, LDL 104, no diabetes, non-smoker, no clear family history of early onset vascular problems).  It is possible that her symptoms are related to heart failure.  Will schedule for coronary CT angiogram.  Would likely require both a beta-blocker and I have a bradycardia and to get a heart rate in range for a good CT. Exertional dyspnea: Reviewed the PFT loops and I am not sure that the based on prebronchodilator loop is really accurate.  Symptoms are generally brought on by exertional and are more in keeping with heart failure than asthma.  Will check echocardiogram.  Also check BNP level. HTN: Fair control. Tachycardia: She has sinus tachycardia which is probably physiological response to what ever is making her short of breath.  No evidence of true arrhythmia.  Stopping all bronchodilators for 3 days has not led to any improvement.  Her most recent hemoglobin was only mildly decreased and unlikely to explain symptoms of shortness of breath or tachycardia.  Will recheck a CBC.  Does not have any other symptoms to suggest hyperthyroidism Anemia: She reports that she is taking iron supplements.  Her chart  mentions sickle cell.  Do not have iron studies available.  Medication Adjustments/Labs and Tests Ordered: Current medicines are reviewed at length with the patient today.  Concerns regarding medicines are outlined above.  Orders Placed This Encounter  Procedures   CT CORONARY MORPH W/CTA COR W/SCORE W/CA W/CM &/OR WO/CM   Basic metabolic panel   Brain natriuretic peptide   CBC   EKG 12-Lead   ECHOCARDIOGRAM COMPLETE   Meds ordered this encounter  Medications   metoprolol tartrate (LOPRESSOR) 100 MG tablet    Sig: Take 1 tablet (100 mg total) by mouth once for 1 dose. PLEASE TAKE METOPROLOL 2 HOURS PRIOR TO CTA SCAN.    Dispense:  1 tablet    Refill:  0   ivabradine (CORLANOR) 5 MG TABS tablet    Sig: Take 1 tablet (5 mg total) by mouth once for 1 dose. Take 2 hours before CT    Dispense:  1 tablet    Refill:  0    Patient Instructions  Medication Instructions:  Take Metoprolol 100 mg and Ivabradine 5mg  2 hours before CT Do not use inhalers before CT *If you need a refill on your cardiac medications before your next appointment, please call your pharmacy*   Lab Work: CBC, BNP, BMP- today If you have labs (blood work) drawn today and your tests are completely normal, you will receive your results only by: MyChart Message (if you have MyChart) OR A paper copy in the mail If you have any lab test that is abnormal or we need to change your treatment, we will call you to review the results.   Testing/Procedures: Your physician has requested that you have an echocardiogram. Echocardiography is a painless test that uses sound waves to create images of your heart. It provides your doctor with information about the size and shape of your heart and how well your heart's chambers and valves are working. This procedure takes approximately one hour. There are no restrictions for this procedure. Please do NOT wear cologne, perfume, aftershave, or lotions (deodorant is  allowed). Please arrive 15 minutes prior to your appointment time.     Your cardiac CT will be scheduled at one of the below locations:   [facility] [address] [place], [place] [address] [phone]  If scheduled at [facility], please arrive at the [facility] and Children's Entrance (Entrance C2) of [facility] 30 minutes prior to test start time. You can use the FREE valet parking offered at entrance C (encouraged to control the heart rate for the test)  Proceed to the [facility] Radiology Department (first floor) to check-in and test prep.  All radiology patients and guests should use entrance C2 at [facility], accessed from [facility], even though the hospital's physical address listed is [address]    On the Night Before the Test: Be sure to Drink plenty of water. Do not consume any caffeinated/decaffeinated beverages or chocolate 12 hours prior to your test. Do not take any antihistamines 12 hours prior to your test.  On the Day of the Test: Drink plenty of water until 1 hour prior to the test. Do not eat any food 1 hour prior to test. Take Metoprolol 100 mg and Ivabradine 5mg  2 hours before CT Do not use inhalers before CT You may take your regular medications prior to the test.  If you take Furosemide/Hydrochlorothiazide/Spironolactone, please HOLD on the morning of the test. FEMALES- please wear underwire-free bra if available, avoid dresses & tight clothing  After  the Test: Drink plenty of water. After receiving IV contrast, you may experience a mild flushed feeling. This is normal. On occasion, you may experience a mild rash up to 24 hours after the test. This is not dangerous. If this occurs, you can take Benadryl 25 mg and increase your fluid intake. If you experience trouble breathing, this can be serious. If it is severe call 911 IMMEDIATELY. If it is mild, please call our office. If you take any  of these medications: Glipizide/Metformin, Avandament, Glucavance, please do not take 48 hours after completing test unless otherwise instructed.  We will call to schedule your test 2-4 weeks out understanding that some insurance companies will need an authorization prior to the service being performed.   For non-scheduling related questions, please contact the cardiac imaging nurse navigator should you have any questions/concerns: Rockwell Alexandria, Cardiac Imaging Nurse Navigator Larey Brick, Cardiac Imaging Nurse Navigator [facility] Direct Office Dial: [phone]   For scheduling needs, including cancellations and rescheduling, please call [place], [phone].      Follow-Up: At [facility], you and your health needs are our priority.  As part of our continuing mission to provide you with exceptional heart care, we have created designated Provider Care Teams.  These Care Teams include your primary Cardiologist (physician) and Advanced Practice Providers (APPs -  Physician Assistants and Nurse Practitioners) who all work together to provide you with the care you need, when you need it.  We recommend signing up for the patient portal called "MyChart".  Sign up information is provided on this After Visit Summary.  MyChart is used to connect with patients for Virtual Visits (Telemedicine).  Patients are able to view lab/test results, encounter notes, upcoming appointments, etc.  Non-urgent messages can be sent to your provider as well.   To learn more about what you can do with MyChart, go to [email or web address].    Your next appointment:    Earliest right after Echo Please (can use DOD day slots)  Provider:   Dr Royann Shivers   Signed, Thurmon Fair, MD  08/10/2022 1:29 PM    [facility]

## 2022-08-10 NOTE — Patient Instructions (Addendum)
Medication Instructions:  Take Metoprolol 100 mg and Ivabradine  2 hours before CT Do not use inhalers before CT *If you need a refill on your cardiac medications before your next appointment, please call your pharmacy*   Lab Work: CBC, BNP, BMP- today If you have labs (blood work) drawn today and your tests are completely normal, you will receive your results only by: MyChart Message (if you have MyChart) OR A paper copy in the mail If you have any lab test that is abnormal or we need to change your treatment, we will call you to review the results.   Testing/Procedures: Your physician has requested that you have an echocardiogram. Echocardiography is a painless test that uses sound waves to create images of your heart. It provides your doctor with information about the size and shape of your heart and how well your heart's chambers and valves are working. This procedure takes approximately one hour. There are no restrictions for this procedure. Please do NOT wear cologne, perfume, aftershave, or lotions (deodorant is allowed). Please arrive 15 minutes prior to your appointment time.     Your cardiac CT will be scheduled at one of the below locations:   [facility] [address] [place], [place] [address] [phone]  If scheduled at [facility], please arrive at the [facility] and Children's Entrance (Entrance C2) of [facility] 30 minutes prior to test start time. You can use the FREE valet parking offered at entrance C (encouraged to control the heart rate for the test)  Proceed to the [facility] Radiology Department (first floor) to check-in and test prep.  All radiology patients and guests should use entrance C2 at [facility], accessed from [facility], even though the hospital's physical address listed is [address]    On the Night Before the Test: Be sure to Drink plenty of water. Do not consume any  caffeinated/decaffeinated beverages or chocolate 12 hours prior to your test. Do not take any antihistamines 12 hours prior to your test.  On the Day of the Test: Drink plenty of water until 1 hour prior to the test. Do not eat any food 1 hour prior to test. Take Metoprolol 100 mg and Ivabradine  2 hours before CT Do not use inhalers before CT You may take your regular medications prior to the test.  If you take Furosemide/Hydrochlorothiazide/Spironolactone, please HOLD on the morning of the test. FEMALES- please wear underwire-free bra if available, avoid dresses & tight clothing  After the Test: Drink plenty of water. After receiving IV contrast, you may experience a mild flushed feeling. This is normal. On occasion, you may experience a mild rash up to 24 hours after the test. This is not dangerous. If this occurs, you can take Benadryl 25 mg and increase your fluid intake. If you experience trouble breathing, this can be serious. If it is severe call 911 IMMEDIATELY. If it is mild, please call our office. If you take any of these medications: Glipizide/Metformin, Avandament, Glucavance, please do not take 48 hours after completing test unless otherwise instructed.  We will call to schedule your test 2-4 weeks out understanding that some insurance companies will need an authorization prior to the service being performed.   For non-scheduling related questions, please contact the cardiac imaging nurse navigator should you have any questions/concerns: Rockwell Alexandria, Cardiac Imaging Nurse Navigator Larey Brick, Cardiac Imaging Nurse Navigator [facility] Direct Office Dial:  [phone]   For scheduling needs, including cancellations and rescheduling, please call [place], [phone].      Follow-Up: At [facility], you and your health needs are our priority.  As part of our continuing mission to provide you with exceptional heart care, we  have created designated Provider Care Teams.  These Care Teams include your primary Cardiologist (physician) and Advanced Practice Providers (APPs -  Physician Assistants and Nurse Practitioners) who all work together to provide you with the care you need, when you need it.  We recommend signing up for the patient portal called "MyChart".  Sign up information is provided on this After Visit Summary.  MyChart is used to connect with patients for Virtual Visits (Telemedicine).  Patients are able to view lab/test results, encounter notes, upcoming appointments, etc.  Non-urgent messages can be sent to your provider as well.   To learn more about what you can do with MyChart, go to [email or web address].    Your next appointment:    Earliest right after Echo Please (can use DOD day slots)  Provider:   Dr Royann Shivers

## 2022-08-11 LAB — CBC
Hematocrit: 37.6 % (ref 34.0–46.6)
Hemoglobin: 12.4 g/dL (ref 11.1–15.9)
MCH: 28.5 pg (ref 26.6–33.0)
MCHC: 33 g/dL (ref 31.5–35.7)
MCV: 86 fL (ref 79–97)
Platelets: 263 10*3/uL (ref 150–450)
RBC: 4.35 x10E6/uL (ref 3.77–5.28)
RDW: 13.1 % (ref 11.7–15.4)
WBC: 5.2 10*3/uL (ref 3.4–10.8)

## 2022-08-11 LAB — BASIC METABOLIC PANEL
BUN/Creatinine Ratio: 18 (ref 9–23)
BUN: 13 mg/dL (ref 6–24)
CO2: 24 mmol/L (ref 20–29)
Calcium: 9.8 mg/dL (ref 8.7–10.2)
Chloride: 102 mmol/L (ref 96–106)
Creatinine, Ser: 0.74 mg/dL (ref 0.57–1.00)
Glucose: 140 mg/dL — ABNORMAL HIGH (ref 70–99)
Potassium: 4.2 mmol/L (ref 3.5–5.2)
Sodium: 143 mmol/L (ref 134–144)
eGFR: 97 mL/min/{1.73_m2} (ref >59)

## 2022-08-11 LAB — BRAIN NATRIURETIC PEPTIDE: BNP: 4.1 pg/mL (ref 0.0–100.0)

## 2022-08-16 ENCOUNTER — Telehealth (HOSPITAL_COMMUNITY): Payer: Self-pay | Admitting: *Deleted

## 2022-08-16 NOTE — Telephone Encounter (Signed)
Attempted to call patient regarding upcoming cardiac CT appointment. °Left message on voicemail with name and callback number ° °Melina Mosteller RN Navigator Cardiac Imaging °[facility] °[phone] Office °[phone] Cell ° °

## 2022-08-17 ENCOUNTER — Encounter (HOSPITAL_COMMUNITY): Payer: Self-pay

## 2022-08-17 ENCOUNTER — Ambulatory Visit (HOSPITAL_COMMUNITY): Admission: RE | Admit: 2022-08-17 | Payer: Commercial Managed Care - PPO | Source: Ambulatory Visit

## 2022-08-23 ENCOUNTER — Other Ambulatory Visit (HOSPITAL_COMMUNITY): Payer: Self-pay

## 2022-08-24 ENCOUNTER — Other Ambulatory Visit: Payer: Self-pay

## 2022-08-25 ENCOUNTER — Other Ambulatory Visit (HOSPITAL_COMMUNITY): Payer: Self-pay

## 2022-08-25 MED ORDER — FLUTICASONE PROPIONATE 50 MCG/ACT NA SUSP
1.0000 | Freq: Two times a day (BID) | NASAL | 2 refills | Status: AC
  Filled 2022-08-25: qty 16, 30d supply, fill #0
  Filled 2022-09-27: qty 16, 30d supply, fill #1

## 2022-08-29 NOTE — Progress Notes (Deleted)
Follow Up Note  RE: Mandy Riggs MRN: [id number] DOB: July 26, 1970 Date of Office Visit: 08/30/2022  Referring provider: Tally Joe, MD Primary care provider: Tally Joe, MD  Chief Complaint: No chief complaint on file.  History of Present Illness: I had the pleasure of seeing Mandy Riggs for a follow up visit at the [facility] on 08/29/2022. She is a 52 y.o. female, who is being followed for asthma, allergic rhinitis and GERD. Her previous allergy office visit was on 06/30/2022 with Dr. Selena Batten. Today is a regular follow up visit.  Moderate persistent asthma without complication Noted shortness of breath, chest tightness, coughing with posttussive emesis and wheezing for 1 year.  Using albuterol as needed with some benefit at times.  Triggers include smoke and dust.  2 courses of prednisone in the last year.  Takes PPI as needed for reflux.  Concerned about allergic triggers. In the past had lisinopril induced cough. Today's skin testing showed: Positive to perennial mold only. Negative to common foods.  Today's spirometry showed: No overt abnormalities noted given today's efforts with 8% improvement in FEV1 post bronchodilator treatment. Clinically feeling slightly improved.  Okay to take prednisone as prescribed. Clinical symptoms and spirometry concerning for asthma/reactive airway disease. Daily controller medication(s): start Wixela 1 puff twice a day. Demonstrated proper use. Let us know if too expensive.  Continue Singulair (montelukast) 10mg  daily at night. May use albuterol rescue inhaler 2 puffs every 4 to 6 hours as needed for shortness of breath, chest tightness, coughing, and wheezing. Monitor frequency of use.  Spacer given and demonstrated proper use with inhaler. Patient understood technique and all questions/concerned were addressed.  Get spirometry at next visit.   Other allergic rhinitis Main complaint of postnasal drip and tried  Flonase, Singulair, Xyzal with some benefit.  No prior allergy evaluation. Today's skin testing showed: Positive to perennial mold only. Discussed with patient that above allergen does not explain her symptoms.  Start environmental control measures as below. Continue Singulair (montelukast) 10mg  daily at night. Use azelastine nasal spray 1-2 sprays per nostril twice a day as needed for runny nose/drainage. Use over the counter antihistamines such as Zyrtec (cetirizine), Claritin (loratadine), Allegra (fexofenadine), or Xyzal (levocetirizine) daily as needed. May take twice a day during allergy flares. May switch antihistamines every few months. If no improvement will get bloodwork and/or ENT referral next.    Gastroesophageal reflux disease See handout for lifestyle and dietary modifications.   Return in about 2 months (around 08/30/2022).  Assessment and Plan: Mandy Riggs is a 52 y.o. female with: No problem-specific Assessment & Plan notes found for this encounter.  No follow-ups on file.  No orders of the defined types were placed in this encounter.  Lab Orders  No laboratory test(s) ordered today    Diagnostics: Spirometry:  Tracings reviewed. Her effort: {Blank single:19197::"Good reproducible efforts.","It was hard to get consistent efforts and there is a question as to whether this reflects a maximal maneuver.","Poor effort, data can not be interpreted."} FVC: ***L FEV1: ***L, ***% predicted FEV1/FVC ratio: ***% Interpretation: {Blank single:19197::"Spirometry consistent with mild obstructive disease","Spirometry consistent with moderate obstructive disease","Spirometry consistent with severe obstructive disease","Spirometry consistent with possible restrictive disease","Spirometry consistent with mixed obstructive and restrictive disease","Spirometry uninterpretable due to technique","Spirometry consistent with normal pattern","No overt abnormalities noted given today's efforts"}.   Please see scanned spirometry results for details.  Skin Testing: {Blank single:19197::"Select foods","Environmental allergy panel","Environmental allergy panel and select foods","Food allergy panel","None","Deferred due to recent  antihistamines use"}. *** Results discussed with patient/family.   Medication List:  Current Outpatient Medications  Medication Sig Dispense Refill   albuterol (PROAIR HFA) 108 (90 Base) MCG/ACT inhaler Inhale 1-2 puffs into the lungs every 6 (six) hours as needed. 6.7 g 0   ALPRAZolam (XANAX) 0.5 MG tablet Take 0.5-1 tablets (0.25-0.5 mg total) by mouth every 12 (twelve) hours as needed. (Patient not taking: Reported on 08/10/2022) 10 tablet 0   amLODipine (NORVASC) 5 MG tablet Take 1 tablet (5 mg total) by mouth daily. 90 tablet 3   azelastine (ASTELIN) 0.1 % nasal spray Place 1-2 sprays into both nostrils 2 (two) times daily as needed (nasal drainage), as directed (Patient not taking: Reported on 08/10/2022) 30 mL 5   benzonatate (TESSALON) 200 MG capsule Take 1 capsule (200 mg total) by mouth 2 (two) times daily as needed for cough. (Patient not taking: Reported on 08/10/2022) 20 capsule 0   chlorpheniramine-HYDROcodone (TUSSIONEX) 10-8 MG/5ML Take 5 mLs by mouth every 12 (twelve) hours as needed 120 mL 0   esomeprazole (NEXIUM) 20 MG capsule Take 20 mg by mouth as needed.     ferrous sulfate 325 (65 FE) MG tablet Take 325 mg by mouth daily with breakfast.     fluticasone (FLONASE) 50 MCG/ACT nasal spray Place 1 spray into both nostrils 2 (two) times daily. 16 g 2   fluticasone-salmeterol (WIXELA INHUB) 100-50 MCG/ACT AEPB Inhale 1 puff into the lungs 2 (two) times daily. Rinse mouth after each use. 60 each 3   Hyoscyamine Sulfate SL (OSCIMIN) 0.125 MG SUBL Place 1 tablet under the tongue every 6 (six) hours as needed. (Patient not taking: Reported on 08/10/2022) 30 tablet 1   ipratropium (ATROVENT) 0.06 % nasal spray Place 2 sprays into both nostrils 4 (four) times  daily. (Patient not taking: Reported on 08/10/2022) 15 mL 0   metoprolol tartrate (LOPRESSOR) 100 MG tablet Take 1 tablet (100 mg total) by mouth once for 1 dose. PLEASE TAKE METOPROLOL 2 HOURS PRIOR TO CTA SCAN. 1 tablet 0   montelukast (SINGULAIR) 10 MG tablet Take 1 tablet (10 mg total) by mouth daily. 90 tablet 3   polycarbophil (FIBERCON) 625 MG tablet Take 625 mg by mouth as needed.     predniSONE (DELTASONE) 20 MG tablet Take 2 tablets (40 mg total) by mouth daily with breakfast. 14 tablet 0   promethazine-dextromethorphan (PROMETHAZINE-DM) 6.25-15 MG/5ML syrup Take 5 mLs by mouth every 6 (six) hours as needed for 7 days 140 mL 0   telmisartan (MICARDIS) 20 MG tablet Take 1 tablet (20 mg total) by mouth daily. 90 tablet 3   VITAMIN D, ERGOCALCIFEROL, PO Take 1,000 mg by mouth daily.     Current Facility-Administered Medications  Medication Dose Route Frequency Provider Last Rate Last Admin   0.9 %  sodium chloride infusion  500 mL Intravenous Continuous Hilarie Fredrickson, MD       Allergies: Allergies  Allergen Reactions   Latex Hives and Itching    Other reaction(s): Not available   Ace Inhibitors Cough   Hct [Hydrochlorothiazide] Other (See Comments)    cramping   Hydrocodone Itching    Other reaction(s): Not available   I reviewed her past medical history, social history, family history, and environmental history and no significant changes have been reported from her previous visit.  Review of Systems  Constitutional:  Negative for appetite change, chills, fever and unexpected weight change.  HENT:  Positive for postnasal drip. Negative for congestion.  Eyes:  Negative for itching.  Respiratory:  Positive for cough, chest tightness, shortness of breath and wheezing.   Cardiovascular:  Negative for chest pain.  Gastrointestinal:  Negative for abdominal pain.  Genitourinary:  Negative for difficulty urinating.  Skin:  Negative for rash.  Allergic/Immunologic: Positive for  environmental allergies.  Neurological:  Negative for headaches.    Objective: There were no vitals taken for this visit. There is no height or weight on file to calculate BMI. Physical Exam Vitals and nursing note reviewed.  Constitutional:      Appearance: Normal appearance. She is well-developed.  HENT:     Head: Normocephalic and atraumatic.     Right Ear: Tympanic membrane and external ear normal.     Left Ear: Tympanic membrane and external ear normal.     Nose: Congestion present.     Mouth/Throat:     Mouth: Mucous membranes are moist.     Pharynx: Oropharynx is clear.  Eyes:     Conjunctiva/sclera: Conjunctivae normal.  Cardiovascular:     Rate and Rhythm: Normal rate and regular rhythm.     Heart sounds: Normal heart sounds. No murmur heard.    No friction rub. No gallop.  Pulmonary:     Effort: Pulmonary effort is normal.     Breath sounds: Normal breath sounds. No wheezing, rhonchi or rales.  Musculoskeletal:     Cervical back: Neck supple.  Skin:    General: Skin is warm.     Findings: No rash.  Neurological:     Mental Status: She is alert and oriented to person, place, and time.  Psychiatric:        Behavior: Behavior normal.    Previous notes and tests were reviewed. The plan was reviewed with the patient/family, and all questions/concerned were addressed.  It was my pleasure to see Mandy Riggs today and participate in her care. Please feel free to contact me with any questions or concerns.  Sincerely,  Wyline Mood, DO Allergy & Immunology  [facility] office: [phone] [facility] office: [phone]

## 2022-08-30 ENCOUNTER — Ambulatory Visit: Payer: Commercial Managed Care - PPO | Admitting: Allergy

## 2022-08-30 DIAGNOSIS — K219 Gastro-esophageal reflux disease without esophagitis: Secondary | ICD-10-CM

## 2022-08-30 DIAGNOSIS — J3089 Other allergic rhinitis: Secondary | ICD-10-CM

## 2022-08-30 DIAGNOSIS — J4540 Moderate persistent asthma, uncomplicated: Secondary | ICD-10-CM

## 2022-08-30 DIAGNOSIS — J309 Allergic rhinitis, unspecified: Secondary | ICD-10-CM

## 2022-09-01 ENCOUNTER — Ambulatory Visit (HOSPITAL_COMMUNITY): Payer: Commercial Managed Care - PPO | Attending: Cardiology

## 2022-09-01 ENCOUNTER — Other Ambulatory Visit (HOSPITAL_COMMUNITY): Payer: Self-pay

## 2022-09-01 DIAGNOSIS — R079 Chest pain, unspecified: Secondary | ICD-10-CM | POA: Diagnosis not present

## 2022-09-01 DIAGNOSIS — R0602 Shortness of breath: Secondary | ICD-10-CM | POA: Insufficient documentation

## 2022-09-01 LAB — ECHOCARDIOGRAM COMPLETE
Area-P 1/2: 4.74 cm2
S' Lateral: 3 cm

## 2022-09-02 ENCOUNTER — Telehealth: Payer: Self-pay | Admitting: Cardiovascular Disease

## 2022-09-02 DIAGNOSIS — E7800 Pure hypercholesterolemia, unspecified: Secondary | ICD-10-CM

## 2022-09-02 NOTE — Telephone Encounter (Signed)
New Message:     Patient wants Dr C to know that she can not afford to have the CT that he  had ordered for her. It cost too much for her to pay out of pocket.

## 2022-09-02 NOTE — Telephone Encounter (Signed)
LVM that will send this to Dr Royann Shivers and once responds will let her know if any other recommendations

## 2022-09-03 NOTE — Telephone Encounter (Signed)
Spoke with patient states she can do this test.  Please order and let her know where.

## 2022-09-03 NOTE — Addendum Note (Signed)
Addended by: Scheryl Marten on: 09/03/2022 03:54 PM   Modules accepted: Orders

## 2022-09-03 NOTE — Progress Notes (Signed)
Thanks for update

## 2022-09-03 NOTE — Telephone Encounter (Signed)
Let her know that I understand and that I stongly suspect that her symptoms are not due to heart disease, especially based on the normal echo findings.  Although it will not give [place] all the information that a CT agio would give [place], if she can afford the $99 coronary calcium score, that would still be useful to help US guide recommendations around how to address her mildly elevated cholesterol level.

## 2022-09-30 ENCOUNTER — Ambulatory Visit (HOSPITAL_COMMUNITY): Payer: Commercial Managed Care - PPO

## 2022-10-11 ENCOUNTER — Ambulatory Visit: Payer: Commercial Managed Care - PPO | Admitting: Cardiovascular Disease

## 2022-10-18 ENCOUNTER — Other Ambulatory Visit: Payer: Self-pay | Admitting: Allergy

## 2022-10-18 ENCOUNTER — Other Ambulatory Visit: Payer: Self-pay

## 2022-10-18 ENCOUNTER — Other Ambulatory Visit (HOSPITAL_COMMUNITY): Payer: Self-pay

## 2022-10-19 ENCOUNTER — Other Ambulatory Visit (HOSPITAL_COMMUNITY): Payer: Commercial Managed Care - PPO

## 2022-10-20 ENCOUNTER — Other Ambulatory Visit (HOSPITAL_COMMUNITY): Payer: Self-pay

## 2022-10-22 ENCOUNTER — Other Ambulatory Visit: Payer: Self-pay | Admitting: Family Medicine

## 2022-10-22 DIAGNOSIS — Z1231 Encounter for screening mammogram for malignant neoplasm of breast: Secondary | ICD-10-CM

## 2022-11-12 ENCOUNTER — Other Ambulatory Visit (HOSPITAL_COMMUNITY): Payer: Self-pay

## 2022-11-22 ENCOUNTER — Ambulatory Visit
Admission: RE | Admit: 2022-11-22 | Discharge: 2022-11-22 | Disposition: A | Discharge status: Home / Self Care | Payer: Commercial Managed Care - PPO | Source: Ambulatory Visit | Attending: Family Medicine | Admitting: Family Medicine

## 2022-11-22 ENCOUNTER — Other Ambulatory Visit (HOSPITAL_COMMUNITY): Payer: Self-pay

## 2022-11-22 DIAGNOSIS — Z1231 Encounter for screening mammogram for malignant neoplasm of breast: Secondary | ICD-10-CM | POA: Diagnosis not present

## 2022-11-23 ENCOUNTER — Other Ambulatory Visit (HOSPITAL_COMMUNITY): Payer: Self-pay

## 2022-11-23 MED ORDER — TELMISARTAN 20 MG PO TABS
20.0000 mg | ORAL_TABLET | Freq: Every day | ORAL | 0 refills | Status: DC
  Filled 2022-11-23: qty 90, 90d supply, fill #0

## 2022-12-14 ENCOUNTER — Other Ambulatory Visit (HOSPITAL_COMMUNITY): Payer: Self-pay

## 2022-12-14 ENCOUNTER — Other Ambulatory Visit: Payer: Self-pay

## 2022-12-14 DIAGNOSIS — R609 Edema, unspecified: Secondary | ICD-10-CM | POA: Diagnosis not present

## 2022-12-14 DIAGNOSIS — J302 Other seasonal allergic rhinitis: Secondary | ICD-10-CM | POA: Diagnosis not present

## 2022-12-14 DIAGNOSIS — Z8719 Personal history of other diseases of the digestive system: Secondary | ICD-10-CM | POA: Diagnosis not present

## 2022-12-14 DIAGNOSIS — R7309 Other abnormal glucose: Secondary | ICD-10-CM | POA: Diagnosis not present

## 2022-12-14 DIAGNOSIS — E559 Vitamin D deficiency, unspecified: Secondary | ICD-10-CM | POA: Diagnosis not present

## 2022-12-14 DIAGNOSIS — K219 Gastro-esophageal reflux disease without esophagitis: Secondary | ICD-10-CM | POA: Diagnosis not present

## 2022-12-14 DIAGNOSIS — Z0000 Encounter for general adult medical examination without abnormal findings: Secondary | ICD-10-CM | POA: Diagnosis not present

## 2022-12-14 DIAGNOSIS — F419 Anxiety disorder, unspecified: Secondary | ICD-10-CM | POA: Diagnosis not present

## 2022-12-14 DIAGNOSIS — I10 Essential (primary) hypertension: Secondary | ICD-10-CM | POA: Diagnosis not present

## 2022-12-14 MED ORDER — TELMISARTAN 20 MG PO TABS
20.0000 mg | ORAL_TABLET | Freq: Every day | ORAL | 3 refills | Status: AC
  Filled 2022-12-14 – 2023-02-18 (×2): qty 90, 90d supply, fill #0
  Filled 2023-05-19: qty 90, 90d supply, fill #1
  Filled 2023-08-17: qty 90, 90d supply, fill #2

## 2022-12-14 MED ORDER — AMLODIPINE BESYLATE 5 MG PO TABS
5.0000 mg | ORAL_TABLET | Freq: Every day | ORAL | 3 refills | Status: AC
  Filled 2022-12-14 – 2023-02-09 (×2): qty 90, 90d supply, fill #0
  Filled 2023-05-09: qty 90, 90d supply, fill #1

## 2022-12-14 MED ORDER — ALPRAZOLAM 0.5 MG PO TABS
0.2500 mg | ORAL_TABLET | Freq: Two times a day (BID) | ORAL | 0 refills | Status: AC | PRN
  Filled 2022-12-14: qty 10, 5d supply, fill #0

## 2022-12-14 MED ORDER — MONTELUKAST SODIUM 10 MG PO TABS
10.0000 mg | ORAL_TABLET | Freq: Every day | ORAL | 3 refills | Status: AC
  Filled 2022-12-14: qty 90, 90d supply, fill #0

## 2022-12-15 ENCOUNTER — Other Ambulatory Visit: Payer: Self-pay

## 2022-12-15 ENCOUNTER — Other Ambulatory Visit (HOSPITAL_COMMUNITY): Payer: Self-pay

## 2022-12-15 MED ORDER — FREESTYLE LANCETS MISC
1 refills | Status: AC
Start: 2022-12-15 — End: ?
  Filled 2022-12-15: qty 100, 90d supply, fill #0

## 2022-12-15 MED ORDER — OZEMPIC (0.25 OR 0.5 MG/DOSE) 2 MG/3ML ~~LOC~~ SOPN
0.2500 mg | PEN_INJECTOR | SUBCUTANEOUS | 0 refills | Status: DC
  Filled 2022-12-15: qty 3, 28d supply, fill #0
  Filled 2022-12-15: qty 3, 30d supply, fill #0

## 2022-12-15 MED ORDER — ROSUVASTATIN CALCIUM 5 MG PO TABS
5.0000 mg | ORAL_TABLET | Freq: Every day | ORAL | 5 refills | Status: AC
  Filled 2022-12-15: qty 30, 30d supply, fill #0
  Filled 2023-01-10: qty 30, 30d supply, fill #1
  Filled 2023-02-09: qty 30, 30d supply, fill #2
  Filled 2023-03-09: qty 30, 30d supply, fill #3
  Filled 2023-04-09: qty 30, 30d supply, fill #4
  Filled 2023-07-05: qty 30, 30d supply, fill #5

## 2022-12-15 MED ORDER — ACCU-CHEK GUIDE VI STRP
ORAL_STRIP | 1 refills | Status: AC
Start: 2022-12-15 — End: ?
  Filled 2022-12-15: qty 100, 90d supply, fill #0

## 2022-12-15 MED ORDER — FREESTYLE LITE W/DEVICE KIT
1.0000 | PACK | 0 refills | Status: AC
Start: 2022-12-15 — End: ?
  Filled 2022-12-15: qty 1, 30d supply, fill #0

## 2022-12-16 ENCOUNTER — Other Ambulatory Visit (HOSPITAL_COMMUNITY): Payer: Self-pay

## 2023-01-11 ENCOUNTER — Other Ambulatory Visit (HOSPITAL_COMMUNITY): Payer: Self-pay

## 2023-01-11 MED ORDER — OZEMPIC (0.25 OR 0.5 MG/DOSE) 2 MG/3ML ~~LOC~~ SOPN
0.5000 mg | PEN_INJECTOR | SUBCUTANEOUS | 0 refills | Status: DC
  Filled 2023-01-11: qty 3, 28d supply, fill #0

## 2023-02-02 DIAGNOSIS — M79644 Pain in right finger(s): Secondary | ICD-10-CM | POA: Diagnosis not present

## 2023-02-09 ENCOUNTER — Other Ambulatory Visit: Payer: Self-pay

## 2023-02-09 ENCOUNTER — Other Ambulatory Visit (HOSPITAL_COMMUNITY): Payer: Self-pay

## 2023-02-18 ENCOUNTER — Other Ambulatory Visit: Payer: Self-pay

## 2023-02-18 ENCOUNTER — Other Ambulatory Visit (HOSPITAL_COMMUNITY): Payer: Self-pay

## 2023-02-18 MED ORDER — OZEMPIC (0.25 OR 0.5 MG/DOSE) 2 MG/3ML ~~LOC~~ SOPN
0.5000 mg | PEN_INJECTOR | SUBCUTANEOUS | 1 refills | Status: DC
  Filled 2023-02-18: qty 3, 28d supply, fill #0
  Filled 2023-03-13: qty 3, 28d supply, fill #1

## 2023-03-28 ENCOUNTER — Other Ambulatory Visit (HOSPITAL_COMMUNITY): Payer: Self-pay

## 2023-03-28 DIAGNOSIS — Z6834 Body mass index (BMI) 34.0-34.9, adult: Secondary | ICD-10-CM | POA: Diagnosis not present

## 2023-03-28 DIAGNOSIS — R252 Cramp and spasm: Secondary | ICD-10-CM | POA: Diagnosis not present

## 2023-03-28 DIAGNOSIS — R609 Edema, unspecified: Secondary | ICD-10-CM | POA: Diagnosis not present

## 2023-03-28 DIAGNOSIS — E559 Vitamin D deficiency, unspecified: Secondary | ICD-10-CM | POA: Diagnosis not present

## 2023-03-28 DIAGNOSIS — I10 Essential (primary) hypertension: Secondary | ICD-10-CM | POA: Diagnosis not present

## 2023-03-28 DIAGNOSIS — Z8719 Personal history of other diseases of the digestive system: Secondary | ICD-10-CM | POA: Diagnosis not present

## 2023-03-28 DIAGNOSIS — J302 Other seasonal allergic rhinitis: Secondary | ICD-10-CM | POA: Diagnosis not present

## 2023-03-28 DIAGNOSIS — E7800 Pure hypercholesterolemia, unspecified: Secondary | ICD-10-CM | POA: Diagnosis not present

## 2023-03-28 DIAGNOSIS — K219 Gastro-esophageal reflux disease without esophagitis: Secondary | ICD-10-CM | POA: Diagnosis not present

## 2023-03-28 DIAGNOSIS — E1169 Type 2 diabetes mellitus with other specified complication: Secondary | ICD-10-CM | POA: Diagnosis not present

## 2023-03-28 DIAGNOSIS — F419 Anxiety disorder, unspecified: Secondary | ICD-10-CM | POA: Diagnosis not present

## 2023-03-28 MED ORDER — MONTELUKAST SODIUM 10 MG PO TABS
10.0000 mg | ORAL_TABLET | Freq: Every day | ORAL | 3 refills | Status: AC
  Filled 2023-03-28: qty 90, 90d supply, fill #0

## 2023-03-28 MED ORDER — TELMISARTAN 20 MG PO TABS
20.0000 mg | ORAL_TABLET | Freq: Every day | ORAL | 3 refills | Status: AC
  Filled 2023-03-28 – 2023-10-30 (×2): qty 90, 90d supply, fill #0
  Filled 2024-01-25: qty 90, 90d supply, fill #1

## 2023-03-28 MED ORDER — AMLODIPINE BESYLATE 5 MG PO TABS
5.0000 mg | ORAL_TABLET | Freq: Every day | ORAL | 3 refills | Status: AC
  Filled 2023-03-28 – 2023-10-30 (×2): qty 90, 90d supply, fill #0
  Filled 2024-01-25: qty 90, 90d supply, fill #1

## 2023-03-28 MED ORDER — ALPRAZOLAM 0.5 MG PO TABS
0.2500 mg | ORAL_TABLET | Freq: Two times a day (BID) | ORAL | 0 refills | Status: AC | PRN
Start: 2023-03-28 — End: ?
  Filled 2023-03-28: qty 10, 5d supply, fill #0

## 2023-03-28 MED ORDER — ROSUVASTATIN CALCIUM 5 MG PO TABS
5.0000 mg | ORAL_TABLET | Freq: Every day | ORAL | 5 refills | Status: AC
  Filled 2023-05-06: qty 30, 30d supply, fill #0
  Filled 2023-06-02: qty 30, 30d supply, fill #1
  Filled 2023-10-29: qty 30, 30d supply, fill #2
  Filled 2023-12-02: qty 30, 30d supply, fill #3
  Filled 2023-12-30: qty 30, 30d supply, fill #4
  Filled 2024-01-29: qty 30, 30d supply, fill #5

## 2023-03-28 MED ORDER — OZEMPIC (0.25 OR 0.5 MG/DOSE) 2 MG/3ML ~~LOC~~ SOPN
0.5000 mg | PEN_INJECTOR | SUBCUTANEOUS | 5 refills | Status: DC
  Filled 2023-04-11 (×2): qty 3, 28d supply, fill #0
  Filled 2023-05-08: qty 3, 28d supply, fill #1
  Filled 2023-06-02: qty 3, 28d supply, fill #2
  Filled 2023-06-30: qty 3, 28d supply, fill #3
  Filled 2023-07-27: qty 3, 28d supply, fill #4

## 2023-04-11 ENCOUNTER — Other Ambulatory Visit: Payer: Self-pay

## 2023-04-11 ENCOUNTER — Other Ambulatory Visit (HOSPITAL_COMMUNITY): Payer: Self-pay

## 2023-05-06 ENCOUNTER — Other Ambulatory Visit (HOSPITAL_COMMUNITY): Payer: Self-pay

## 2023-07-17 ENCOUNTER — Telehealth: Admitting: Family

## 2023-07-17 DIAGNOSIS — S50869A Insect bite (nonvenomous) of unspecified forearm, initial encounter: Secondary | ICD-10-CM | POA: Diagnosis not present

## 2023-07-17 DIAGNOSIS — W57XXXA Bitten or stung by nonvenomous insect and other nonvenomous arthropods, initial encounter: Secondary | ICD-10-CM | POA: Diagnosis not present

## 2023-07-17 MED ORDER — TRIAMCINOLONE ACETONIDE 0.5 % EX OINT
1.0000 | TOPICAL_OINTMENT | Freq: Two times a day (BID) | CUTANEOUS | 0 refills | Status: AC
Start: 2023-07-17 — End: ?

## 2023-07-17 NOTE — Progress Notes (Signed)
 E-Visit for Insect Sting  Thank you for describing the insect sting for Mandy Riggs.  Here is how we plan to help!  Based on the information you have shared with me it looks like you have:  The 2 greatest risks from insect stings are allergic reaction, which can be fatal in some people and infection, which is more common and less serious.  Bees, wasps, yellow jackets, and hornets belong to a class of insects called Hymenoptera.  Most insect stings cause only minor discomfort.  Stings can happen anywhere on the body and can be painful.  Most stings are from honey bees or yellow jackets.  Fire ants can sting multiple times.  The sites of the stings are more likely to become infected.    Based on your information I have: and Provided a home care guide for insect stings and instructions on when to call for help. I have sent a prescription for kenalog cream that you will apply twice a day.   What can be used to prevent Insect Stings?  Insect repellant with at least 20% DEET.  Wearing long pants and shirts with socks and shoes.  Wear dark or drab-colored clothes rather than bright colors.  Avoid using perfumes and hair sprays; these attract insects.  HOME CARE ADVICE:  1. Stinger removal: The stinger looks like a tiny black dot in the sting. Use a fingernail, credit card edge, or knife-edge to scrape it off.  Don't pull it out because it squeezes out more venom. If the stinger is below the skin surface, leave it alone.  It will be shed with normal skin healing. 2. Use cold compresses to the area of the sting for 10-20 minutes.  You may repeat this as needed to relieve symptoms of pain and swelling. 3.  For pain relief, take acetominophen 650 mg 4-6 hours as needed or ibuprofen 400 mg every 6-8 hours as needed or naproxen 250-500 mg every 12 hours as needed. 4.  You can also use hydrocortisone cream 0.5% or 1% up to 4 times daily as needed for itching. 5.  If the sting becomes very itchy, take  Benadryl 25-50 mg, follow directions on box. 6.  Wash the area 2-3 times daily with antibacterial soap and warm water. 7. Call your Doctor if: Fever, a severe headache, or rash occur in the next 2 weeks. Sting area begins to look infected. Redness and swelling worsens after home treatment. Your current symptoms become worse.    MAKE SURE YOU:  Understand these instructions. Will watch your condition. Will get help right away if you are not doing well or get worse.  Thank you for choosing an e-visit.  Your e-visit answers were reviewed by a board certified advanced clinical practitioner to complete your personal care plan. Depending upon the condition, your plan could have included both over the counter or prescription medications.  Please review your pharmacy choice. Make sure the pharmacy is open so you can pick up prescription now. If there is a problem, you may contact your provider through [employer] and have the prescription routed to another pharmacy.  Your safety is important to Mandy Riggs. If you have drug allergies check your prescription carefully.   For the next 24 hours you can use MyChart to ask questions about today's visit, request a non-urgent call back, or ask for a work or school excuse. You will get an email in the next two days asking about your experience. I hope that your e-visit has been  valuable and will speed your recovery.  Approximately 5 minutes was spent documenting and reviewing patient's chart.

## 2023-07-29 ENCOUNTER — Other Ambulatory Visit: Payer: Self-pay

## 2023-07-29 ENCOUNTER — Other Ambulatory Visit (HOSPITAL_COMMUNITY): Payer: Self-pay

## 2023-07-29 DIAGNOSIS — R252 Cramp and spasm: Secondary | ICD-10-CM | POA: Diagnosis not present

## 2023-07-29 DIAGNOSIS — Z8719 Personal history of other diseases of the digestive system: Secondary | ICD-10-CM | POA: Diagnosis not present

## 2023-07-29 DIAGNOSIS — E7800 Pure hypercholesterolemia, unspecified: Secondary | ICD-10-CM | POA: Diagnosis not present

## 2023-07-29 DIAGNOSIS — E1169 Type 2 diabetes mellitus with other specified complication: Secondary | ICD-10-CM | POA: Diagnosis not present

## 2023-07-29 DIAGNOSIS — K219 Gastro-esophageal reflux disease without esophagitis: Secondary | ICD-10-CM | POA: Diagnosis not present

## 2023-07-29 DIAGNOSIS — R609 Edema, unspecified: Secondary | ICD-10-CM | POA: Diagnosis not present

## 2023-07-29 DIAGNOSIS — E559 Vitamin D deficiency, unspecified: Secondary | ICD-10-CM | POA: Diagnosis not present

## 2023-07-29 DIAGNOSIS — J302 Other seasonal allergic rhinitis: Secondary | ICD-10-CM | POA: Diagnosis not present

## 2023-07-29 DIAGNOSIS — I10 Essential (primary) hypertension: Secondary | ICD-10-CM | POA: Diagnosis not present

## 2023-07-29 DIAGNOSIS — F419 Anxiety disorder, unspecified: Secondary | ICD-10-CM | POA: Diagnosis not present

## 2023-07-29 MED ORDER — MONTELUKAST SODIUM 10 MG PO TABS
10.0000 mg | ORAL_TABLET | Freq: Every day | ORAL | 1 refills | Status: AC
  Filled 2023-07-29: qty 90, 90d supply, fill #0

## 2023-07-29 MED ORDER — ROSUVASTATIN CALCIUM 5 MG PO TABS
5.0000 mg | ORAL_TABLET | Freq: Every day | ORAL | 1 refills | Status: AC
  Filled 2023-07-29: qty 90, 90d supply, fill #0
  Filled 2024-02-28: qty 90, 90d supply, fill #1

## 2023-07-29 MED ORDER — AMLODIPINE BESYLATE 5 MG PO TABS
5.0000 mg | ORAL_TABLET | Freq: Every day | ORAL | 1 refills | Status: AC
  Filled 2023-07-29: qty 90, 90d supply, fill #0
  Filled 2024-04-24 – 2024-05-03 (×2): qty 90, 90d supply, fill #1

## 2023-07-29 MED ORDER — ALPRAZOLAM 0.5 MG PO TABS
0.2500 mg | ORAL_TABLET | Freq: Two times a day (BID) | ORAL | 0 refills | Status: AC | PRN
  Filled 2023-07-29: qty 10, 5d supply, fill #0

## 2023-07-29 MED ORDER — OZEMPIC (1 MG/DOSE) 4 MG/3ML ~~LOC~~ SOPN
1.0000 mg | PEN_INJECTOR | SUBCUTANEOUS | 5 refills | Status: DC
  Filled 2023-07-29: qty 3, 28d supply, fill #0
  Filled 2023-08-21: qty 3, 28d supply, fill #1
  Filled 2023-09-18: qty 3, 28d supply, fill #2
  Filled 2023-10-17: qty 3, 28d supply, fill #3
  Filled 2023-11-10: qty 3, 28d supply, fill #4
  Filled 2023-12-08: qty 3, 28d supply, fill #5

## 2023-07-29 MED ORDER — TELMISARTAN 20 MG PO TABS
20.0000 mg | ORAL_TABLET | Freq: Every day | ORAL | 1 refills | Status: AC
  Filled 2023-07-29: qty 90, 90d supply, fill #0

## 2023-08-09 ENCOUNTER — Encounter (HOSPITAL_COMMUNITY): Payer: Self-pay

## 2023-08-09 ENCOUNTER — Other Ambulatory Visit (HOSPITAL_COMMUNITY): Payer: Self-pay

## 2023-08-09 IMAGING — MG MM DIGITAL DIAGNOSTIC UNILAT*R*
3 series · 3 of 3 positions shown · non-contrast
Comparison: Previous exam(s).

CLINICAL DATA: Screening recall for right breast calcifications.

EXAM:
DIGITAL DIAGNOSTIC UNILATERAL RIGHT MAMMOGRAM
TECHNIQUE: Right digital diagnostic mammography was performed. Mammographic
images were processed with CAD.

[R CC]
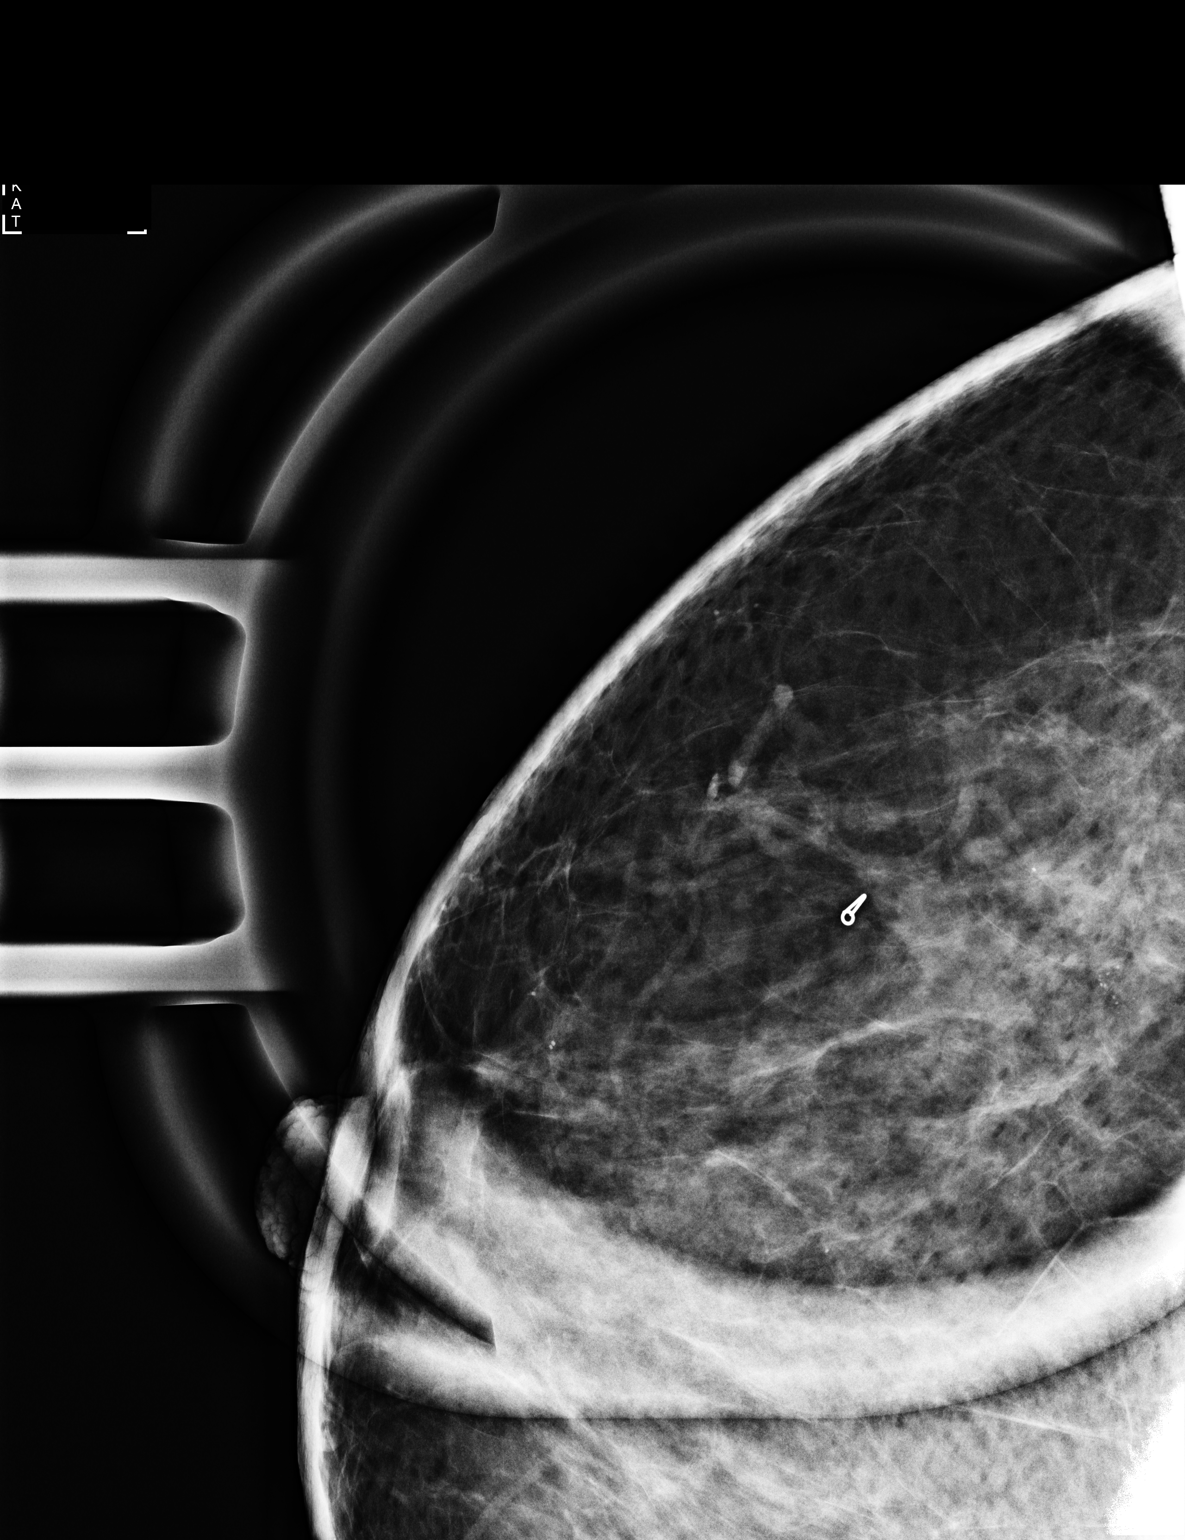

[R ML (1 of 2)]
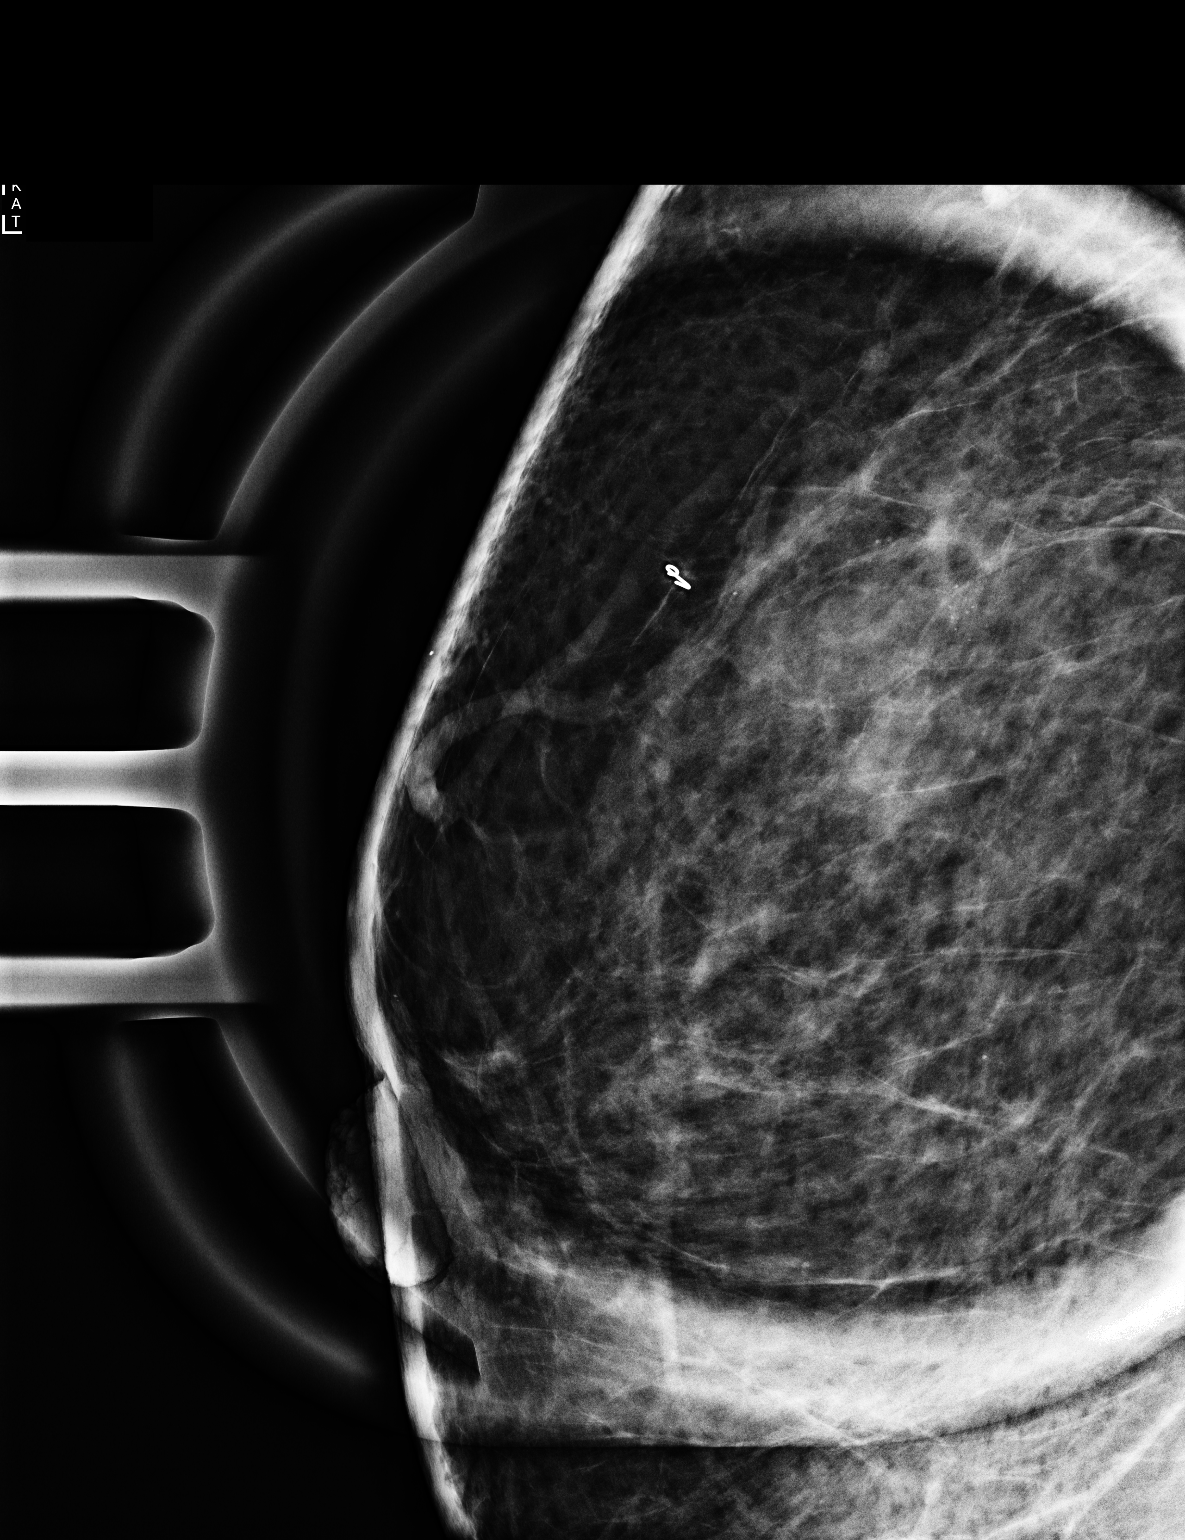

[R ML (2 of 2)]
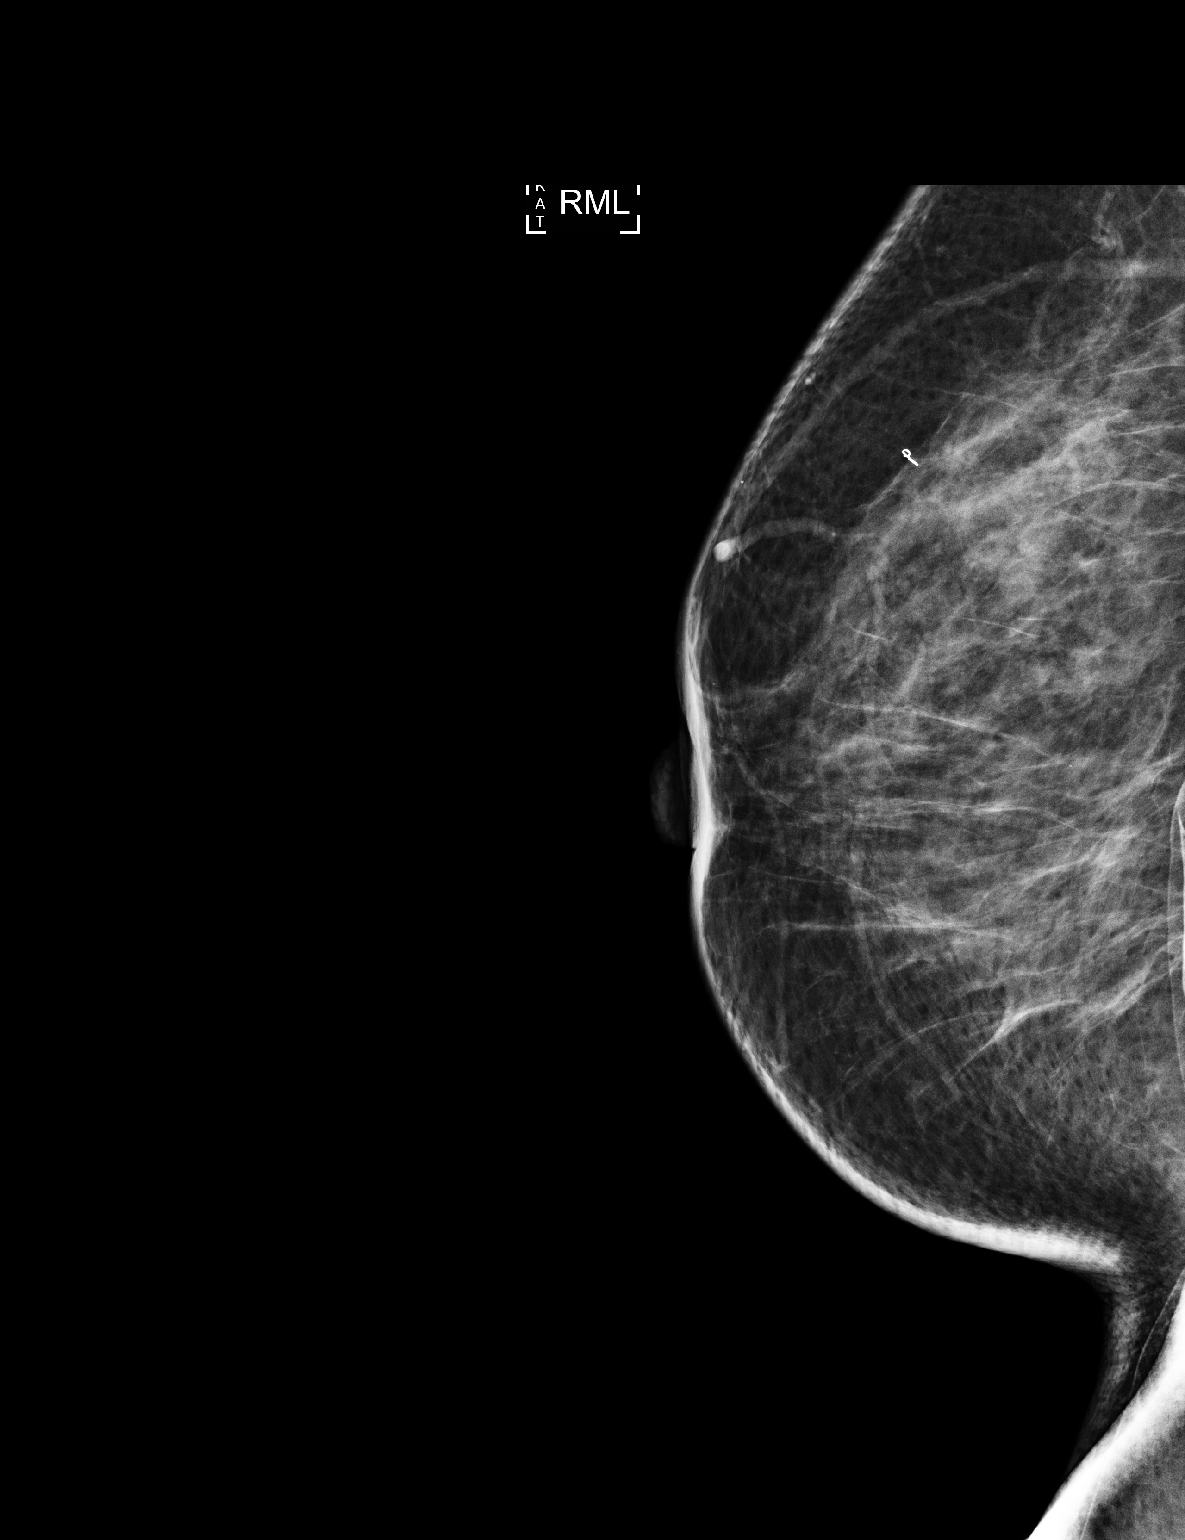

[3 of 3 positions shown; findings below may reference images not displayed]

ACR Breast Density Category c: The breast tissue is heterogeneously
dense, which may obscure small masses.
FINDINGS: There is a small group of calcifications in the posterior, upper
outer right breast, spanning 4 mm. Calcifications are punctate with
no associated mass or distortion and no linearity or branching.
IMPRESSION: Probably benign right breast calcifications. Short-term follow-up
recommended.

RECOMMENDATION:
Diagnostic right breast mammography with magnification views in 6
months.

I have discussed the findings and recommendations with the patient.
If applicable, a reminder letter will be sent to the patient
regarding the next appointment.

BI-RADS CATEGORY  3: Probably benign.

## 2023-08-22 ENCOUNTER — Other Ambulatory Visit (HOSPITAL_COMMUNITY): Payer: Self-pay

## 2023-09-20 ENCOUNTER — Other Ambulatory Visit (HOSPITAL_COMMUNITY): Payer: Self-pay

## 2023-10-10 ENCOUNTER — Other Ambulatory Visit: Payer: Self-pay | Admitting: Family Medicine

## 2023-10-10 DIAGNOSIS — Z1231 Encounter for screening mammogram for malignant neoplasm of breast: Secondary | ICD-10-CM

## 2023-10-17 DIAGNOSIS — E139 Other specified diabetes mellitus without complications: Secondary | ICD-10-CM | POA: Diagnosis not present

## 2023-10-17 DIAGNOSIS — M2142 Flat foot [pes planus] (acquired), left foot: Secondary | ICD-10-CM | POA: Diagnosis not present

## 2023-10-17 DIAGNOSIS — M21962 Unspecified acquired deformity of left lower leg: Secondary | ICD-10-CM | POA: Diagnosis not present

## 2023-10-17 DIAGNOSIS — L603 Nail dystrophy: Secondary | ICD-10-CM | POA: Diagnosis not present

## 2023-10-17 DIAGNOSIS — M7732 Calcaneal spur, left foot: Secondary | ICD-10-CM | POA: Diagnosis not present

## 2023-10-17 DIAGNOSIS — M792 Neuralgia and neuritis, unspecified: Secondary | ICD-10-CM | POA: Diagnosis not present

## 2023-10-17 DIAGNOSIS — M2141 Flat foot [pes planus] (acquired), right foot: Secondary | ICD-10-CM | POA: Diagnosis not present

## 2023-10-17 DIAGNOSIS — Q666 Other congenital valgus deformities of feet: Secondary | ICD-10-CM | POA: Diagnosis not present

## 2023-10-17 DIAGNOSIS — M21961 Unspecified acquired deformity of right lower leg: Secondary | ICD-10-CM | POA: Diagnosis not present

## 2023-10-17 DIAGNOSIS — M7731 Calcaneal spur, right foot: Secondary | ICD-10-CM | POA: Diagnosis not present

## 2023-10-18 ENCOUNTER — Other Ambulatory Visit (HOSPITAL_COMMUNITY): Payer: Self-pay

## 2023-10-31 ENCOUNTER — Other Ambulatory Visit (HOSPITAL_COMMUNITY): Payer: Self-pay

## 2023-11-23 ENCOUNTER — Ambulatory Visit
Admission: RE | Admit: 2023-11-23 | Discharge: 2023-11-23 | Disposition: A | Discharge status: Home / Self Care | Source: Ambulatory Visit | Attending: Family Medicine | Admitting: Family Medicine

## 2023-11-23 DIAGNOSIS — Z1231 Encounter for screening mammogram for malignant neoplasm of breast: Secondary | ICD-10-CM

## 2023-12-09 ENCOUNTER — Other Ambulatory Visit (HOSPITAL_COMMUNITY): Payer: Self-pay

## 2024-01-13 ENCOUNTER — Other Ambulatory Visit (HOSPITAL_COMMUNITY): Payer: Self-pay

## 2024-01-13 DIAGNOSIS — F419 Anxiety disorder, unspecified: Secondary | ICD-10-CM | POA: Diagnosis not present

## 2024-01-13 DIAGNOSIS — E1169 Type 2 diabetes mellitus with other specified complication: Secondary | ICD-10-CM | POA: Diagnosis not present

## 2024-01-13 DIAGNOSIS — K219 Gastro-esophageal reflux disease without esophagitis: Secondary | ICD-10-CM | POA: Diagnosis not present

## 2024-01-13 DIAGNOSIS — R609 Edema, unspecified: Secondary | ICD-10-CM | POA: Diagnosis not present

## 2024-01-13 DIAGNOSIS — E7800 Pure hypercholesterolemia, unspecified: Secondary | ICD-10-CM | POA: Diagnosis not present

## 2024-01-13 DIAGNOSIS — J302 Other seasonal allergic rhinitis: Secondary | ICD-10-CM | POA: Diagnosis not present

## 2024-01-13 DIAGNOSIS — Z0000 Encounter for general adult medical examination without abnormal findings: Secondary | ICD-10-CM | POA: Diagnosis not present

## 2024-01-13 DIAGNOSIS — E559 Vitamin D deficiency, unspecified: Secondary | ICD-10-CM | POA: Diagnosis not present

## 2024-01-13 DIAGNOSIS — I10 Essential (primary) hypertension: Secondary | ICD-10-CM | POA: Diagnosis not present

## 2024-01-13 DIAGNOSIS — Z8719 Personal history of other diseases of the digestive system: Secondary | ICD-10-CM | POA: Diagnosis not present

## 2024-01-13 MED ORDER — ROSUVASTATIN CALCIUM 5 MG PO TABS
5.0000 mg | ORAL_TABLET | Freq: Every day | ORAL | 1 refills | Status: AC
  Filled 2024-01-13 – 2024-05-14 (×3): qty 90, 90d supply, fill #0

## 2024-01-13 MED ORDER — ALPRAZOLAM 0.5 MG PO TABS
0.2500 mg | ORAL_TABLET | Freq: Two times a day (BID) | ORAL | 0 refills | Status: AC | PRN
  Filled 2024-01-13: qty 10, 5d supply, fill #0

## 2024-01-13 MED ORDER — AMLODIPINE BESYLATE 5 MG PO TABS
5.0000 mg | ORAL_TABLET | Freq: Every day | ORAL | 1 refills | Status: AC
  Filled 2024-01-13: qty 90, 90d supply, fill #0

## 2024-01-13 MED ORDER — TELMISARTAN 20 MG PO TABS
20.0000 mg | ORAL_TABLET | Freq: Every day | ORAL | 1 refills | Status: AC
  Filled 2024-04-24 – 2024-05-03 (×2): qty 90, 90d supply, fill #0

## 2024-01-13 MED ORDER — OZEMPIC (1 MG/DOSE) 4 MG/3ML ~~LOC~~ SOPN
1.0000 mg | PEN_INJECTOR | SUBCUTANEOUS | 5 refills | Status: AC
  Filled 2024-01-13: qty 3, 28d supply, fill #0
  Filled 2024-02-05: qty 3, 28d supply, fill #1
  Filled 2024-03-04: qty 3, 28d supply, fill #2
  Filled 2024-04-07: qty 3, 28d supply, fill #3
  Filled 2024-05-03: qty 3, 28d supply, fill #4
  Filled 2024-05-31: qty 3, 28d supply, fill #5

## 2024-01-13 MED ORDER — MONTELUKAST SODIUM 10 MG PO TABS
10.0000 mg | ORAL_TABLET | Freq: Every day | ORAL | 1 refills | Status: AC
  Filled 2024-01-13: qty 90, 90d supply, fill #0

## 2024-01-26 ENCOUNTER — Other Ambulatory Visit (HOSPITAL_COMMUNITY): Payer: Self-pay

## 2024-04-24 ENCOUNTER — Other Ambulatory Visit (HOSPITAL_COMMUNITY): Payer: Self-pay

## 2024-04-24 ENCOUNTER — Other Ambulatory Visit: Payer: Self-pay

## 2024-04-25 ENCOUNTER — Other Ambulatory Visit (HOSPITAL_COMMUNITY): Payer: Self-pay

## 2024-04-25 ENCOUNTER — Encounter: Payer: Self-pay | Admitting: Pharmacist

## 2024-04-25 ENCOUNTER — Other Ambulatory Visit: Payer: Self-pay

## 2024-05-01 ENCOUNTER — Other Ambulatory Visit: Payer: Self-pay

## 2024-05-03 ENCOUNTER — Other Ambulatory Visit: Payer: Self-pay

## 2024-05-03 ENCOUNTER — Other Ambulatory Visit (HOSPITAL_COMMUNITY): Payer: Self-pay

## 2024-05-04 ENCOUNTER — Other Ambulatory Visit: Payer: Self-pay

## 2024-05-11 IMAGING — DX DG CHEST 2V
2 series · 2 of 2 positions shown · non-contrast
Comparison: Chest x-ray 05/28/2009

CLINICAL DATA: Cough.

EXAM:
CHEST - 2 VIEW

[chest pa]
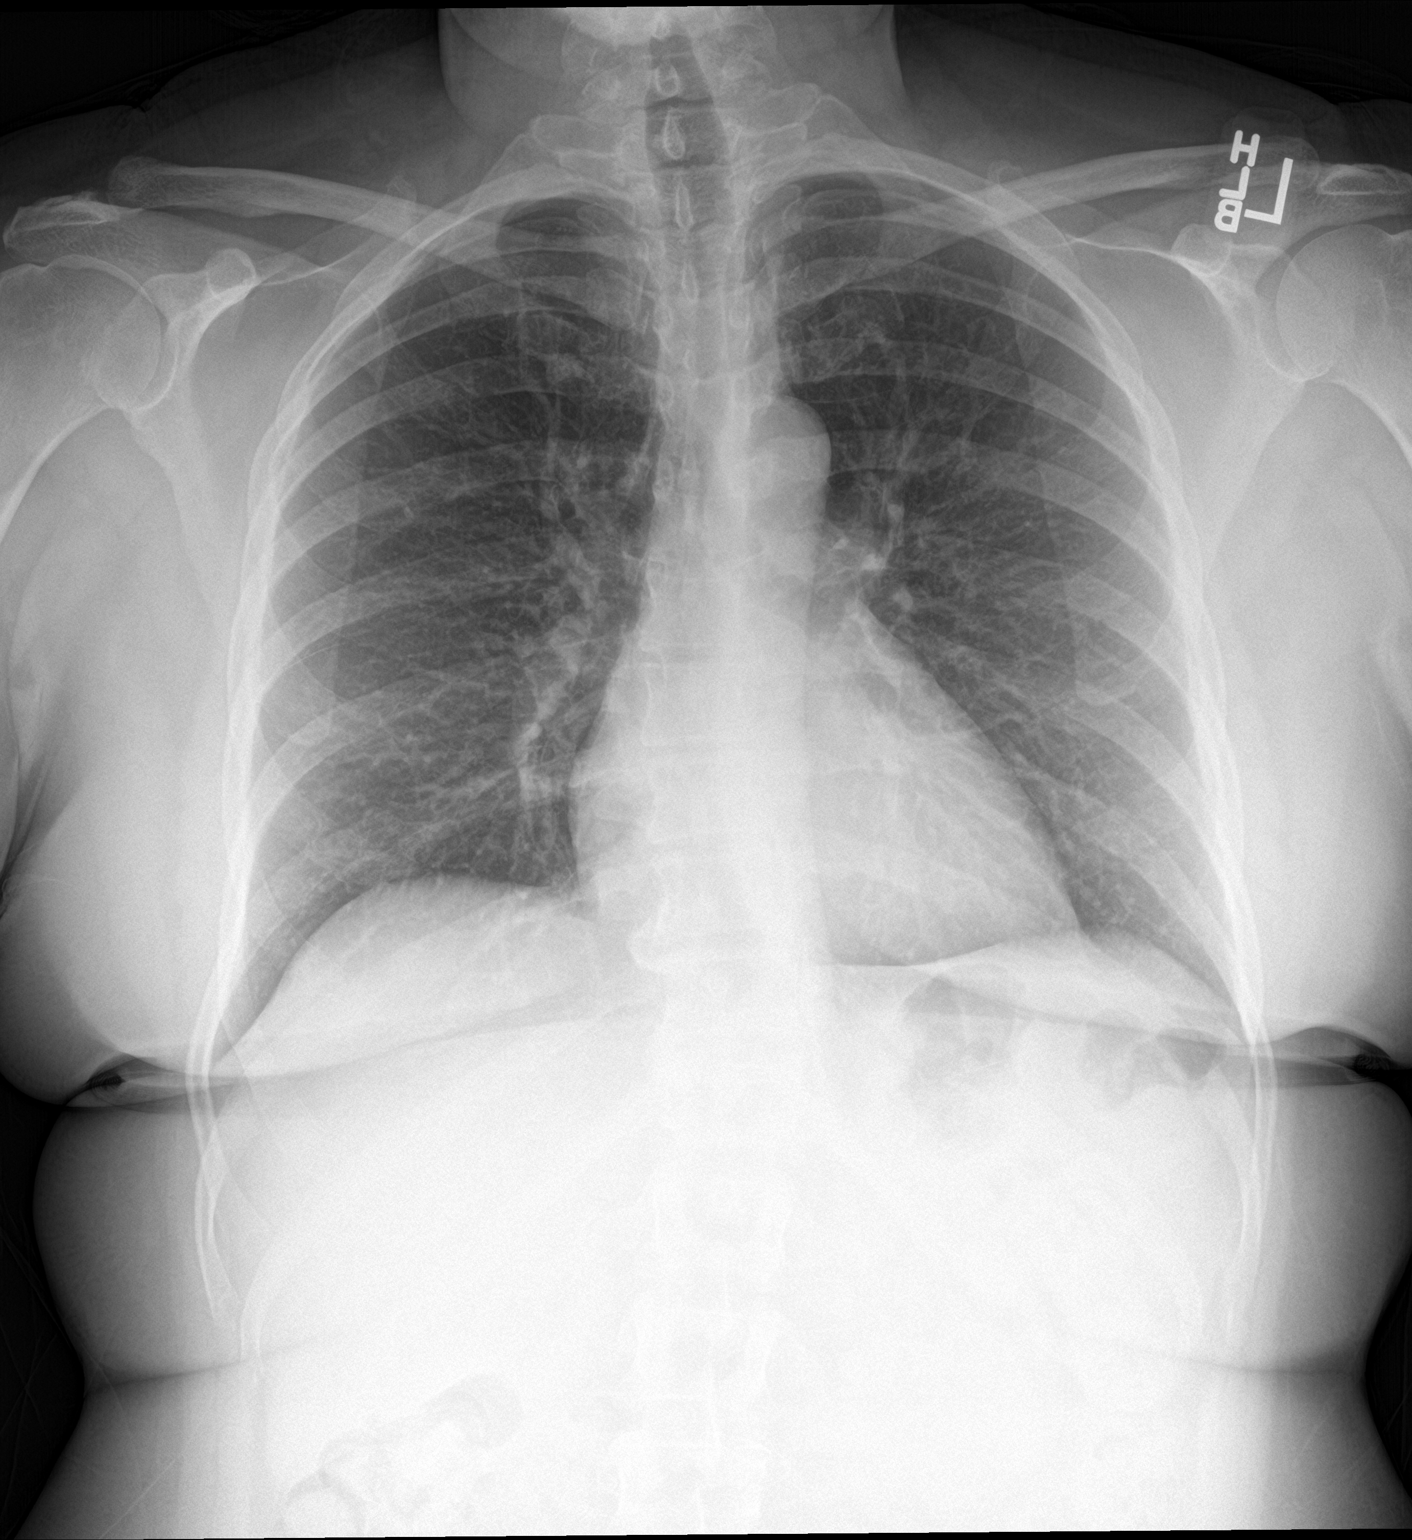

[chest lat]
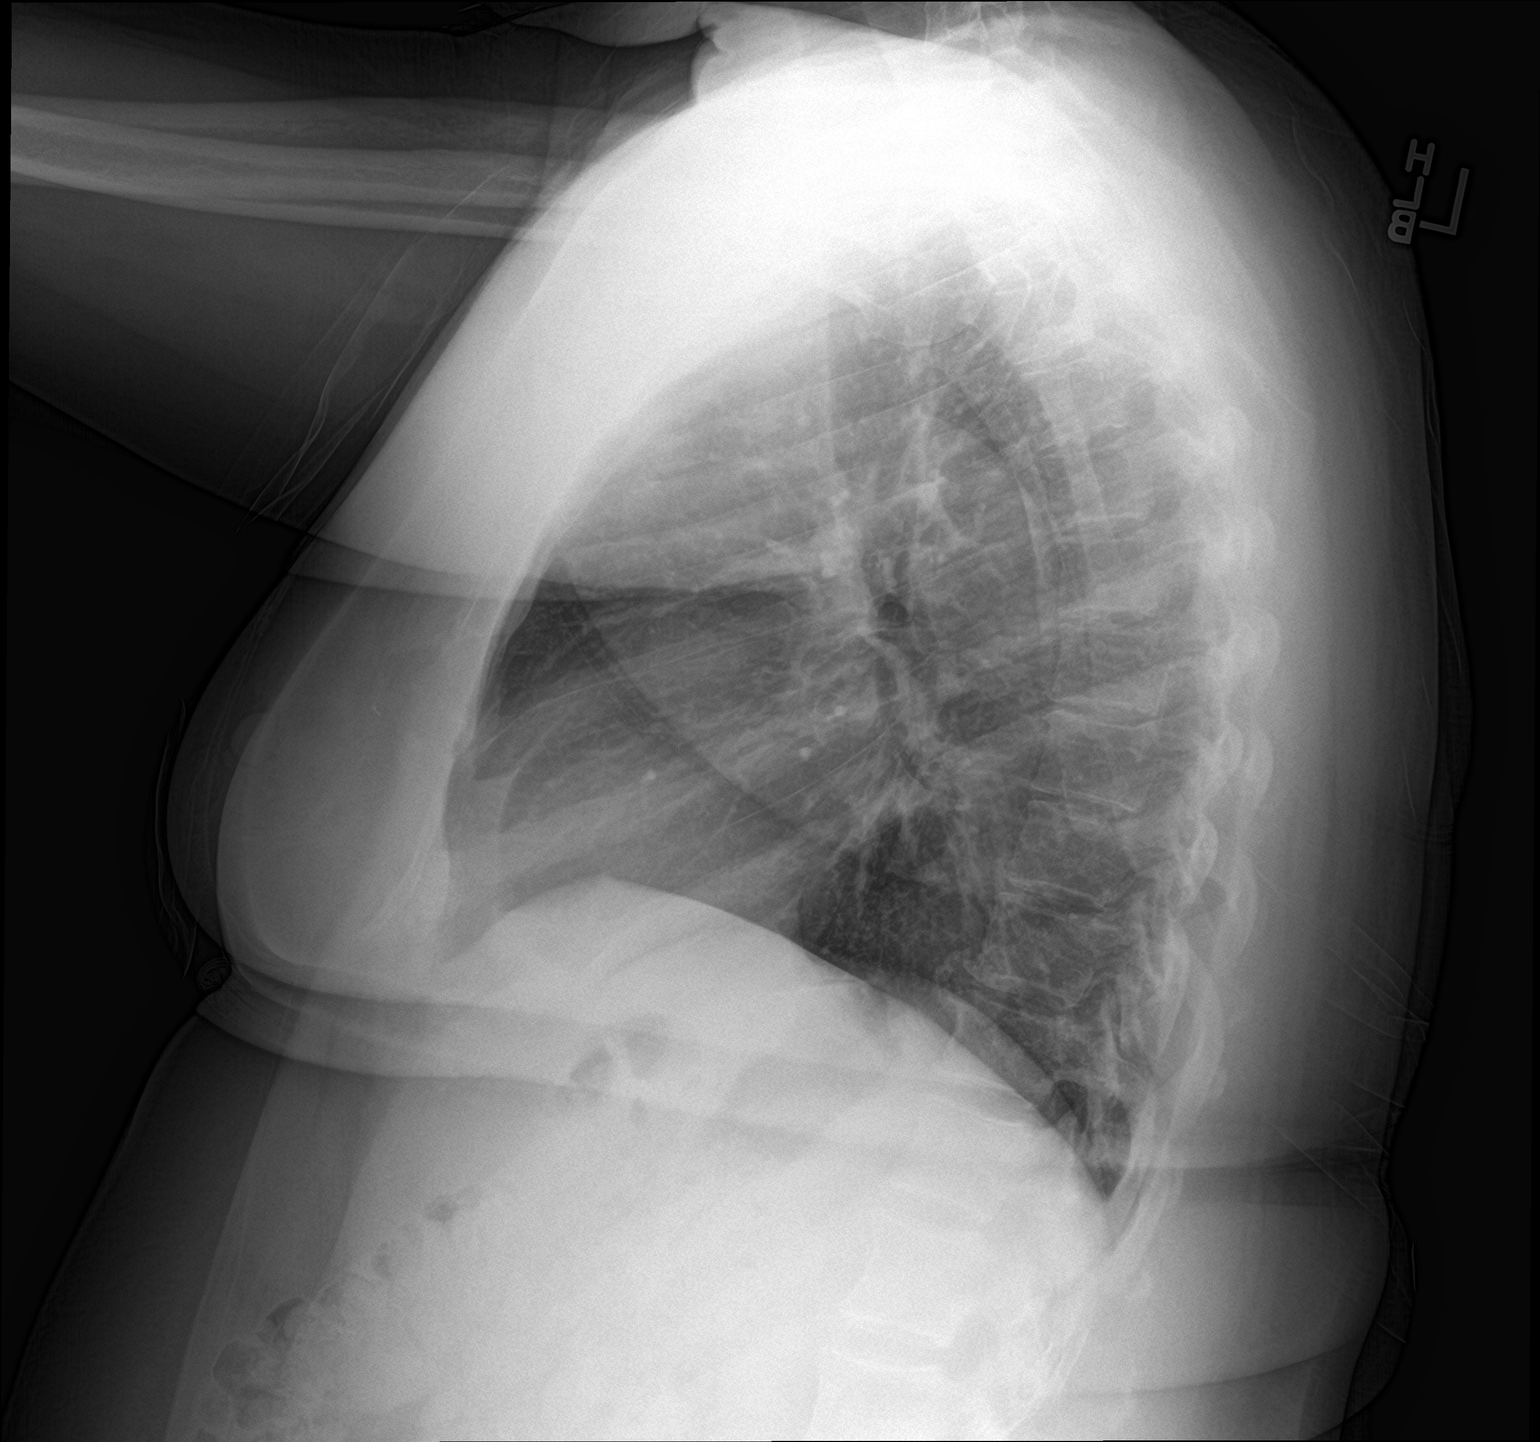

[2 of 2 positions shown; findings below may reference images not displayed]

FINDINGS: The heart size and mediastinal contours are within normal limits.
Both lungs are clear. The visualized skeletal structures are
unremarkable.
IMPRESSION: No active cardiopulmonary disease.

## 2024-05-14 ENCOUNTER — Other Ambulatory Visit: Payer: Self-pay

## 2024-05-14 ENCOUNTER — Other Ambulatory Visit (HOSPITAL_COMMUNITY): Payer: Self-pay
